# Patient Record
Sex: Male | Born: 1970 | Race: Black or African American | Hispanic: No | Smoking: Current every day smoker
Health system: Southern US, Community
[De-identification: ages and names within clinical notes are randomized; demographics above are authoritative.]

## PROBLEM LIST (undated history)

## (undated) DIAGNOSIS — T7840XA Allergy, unspecified, initial encounter: Secondary | ICD-10-CM

## (undated) DIAGNOSIS — I10 Essential (primary) hypertension: Secondary | ICD-10-CM

## (undated) HISTORY — PX: BACK SURGERY: SHX140

## (undated) HISTORY — PX: ABDOMINAL SURGERY: SHX537

---

## 2001-02-16 ENCOUNTER — Emergency Department (HOSPITAL_COMMUNITY): Admission: EM | Admit: 2001-02-16 | Discharge: 2001-02-16 | Payer: Self-pay | Admitting: Emergency Medicine

## 2001-05-26 ENCOUNTER — Emergency Department (HOSPITAL_COMMUNITY): Admission: EM | Admit: 2001-05-26 | Discharge: 2001-05-26 | Payer: Self-pay

## 2002-03-09 ENCOUNTER — Encounter: Payer: Self-pay | Admitting: General Surgery

## 2002-03-09 ENCOUNTER — Encounter: Payer: Self-pay | Admitting: Emergency Medicine

## 2002-03-09 ENCOUNTER — Inpatient Hospital Stay (HOSPITAL_COMMUNITY): Admission: EM | Admit: 2002-03-09 | Discharge: 2002-03-20 | Payer: Self-pay | Admitting: Emergency Medicine

## 2002-03-10 ENCOUNTER — Encounter: Payer: Self-pay | Admitting: General Surgery

## 2002-03-11 ENCOUNTER — Encounter: Payer: Self-pay | Admitting: Surgery

## 2010-07-01 ENCOUNTER — Emergency Department (HOSPITAL_COMMUNITY): Admission: EM | Admit: 2010-07-01 | Discharge: 2010-07-01 | Payer: Self-pay | Admitting: Emergency Medicine

## 2010-07-01 ENCOUNTER — Encounter: Admission: RE | Admit: 2010-07-01 | Discharge: 2010-07-01 | Payer: Self-pay | Admitting: Family Medicine

## 2010-07-10 ENCOUNTER — Ambulatory Visit (HOSPITAL_COMMUNITY): Admission: RE | Admit: 2010-07-10 | Discharge: 2010-07-10 | Payer: Self-pay | Admitting: Neurological Surgery

## 2010-12-16 LAB — CBC
HCT: 44.2 % (ref 39.0–52.0)
MCH: 27.6 pg (ref 26.0–34.0)
MCHC: 33.3 g/dL (ref 30.0–36.0)
MCV: 82.9 fL (ref 78.0–100.0)
RDW: 14.9 % (ref 11.5–15.5)

## 2010-12-16 LAB — DIFFERENTIAL
Basophils Absolute: 0 10*3/uL (ref 0.0–0.1)
Basophils Relative: 0 % (ref 0–1)
Eosinophils Relative: 2 % (ref 0–5)
Lymphocytes Relative: 35 % (ref 12–46)
Monocytes Absolute: 0.9 10*3/uL (ref 0.1–1.0)

## 2010-12-16 LAB — BASIC METABOLIC PANEL
BUN: 12 mg/dL (ref 6–23)
CO2: 31 mEq/L (ref 19–32)
Chloride: 99 mEq/L (ref 96–112)
Glucose, Bld: 89 mg/dL (ref 70–99)
Potassium: 4.4 mEq/L (ref 3.5–5.1)

## 2010-12-16 LAB — SURGICAL PCR SCREEN: Staphylococcus aureus: NEGATIVE

## 2011-02-19 NOTE — H&P (Signed)
[place]. [facility]  Patient:    Timothy Stewart, Timothy Stewart Visit Number: [id number] MRN: [id number]          Service Type: MED Location: [phone] Attending Physician:  Arlis Porta Dictated by:   Adolph Pollack, M.D. Admit Date:  03/08/2002   CC:         [facility] [address]   History and Physical  CHIEF COMPLAINT:  Crampy abdominal pain with distention, nausea, and vomiting.  HISTORY OF PRESENT ILLNESS:  This is a 40 year old male who had some [place] food two days ago for supper.  He then awoke at 1 a.m. with abdominal distention and cramping pain.  His wife gave him a laxative and then he began having some vomiting.  At first was all of the food he ate and then his wife said that it was greenish.  He has not vomited in over 24 hours.  He did have a bowel movement.  He continued to have the pains and then presented to the emergency department to be evaluated.  During his evaluation, a CT scan of the abdomen was performed.  This was read as having a small bowel obstruction.  No cause can be seen.  The appendix was normal.  It was for this reason that I was asked to see him.  His wife said that he had something like this in the distant past, but not quite as severe, underwent an evaluation, and was told that he had irritable bowel syndrome.  PAST MEDICAL HISTORY:  No chronic illnesses.  PAST SURGICAL HISTORY:  Removal of foot bone spurs.  ALLERGIES:  CODEINE.  CURRENT MEDICATIONS:  None.  SOCIAL HISTORY:  Smokes half of a pack of cigarettes a day.  He has a alcoholic beverage weekly.  He is married.  FAMILY HISTORY:  He had a brother with leukemia.  Father with hypertension.  REVIEW OF SYSTEMS:  Cardiovascular:  No heart disease or hypertension. Pulmonary:  He had pneumonia in the past.  No asthma.  No TB.  GI:  No peptic ulcer disease or diverticulitis.  No colitis.  GU:  No kidney stones. Endocrine:  No  thyroid disease or diabetes.  Hematologic:  No bleeding disorders, transfusions, or deep venous thrombosis.  Neurologic:  No strokes or seizures.  PHYSICAL EXAMINATION:  GENERAL APPEARANCE:  A well-developed, well-nourished male in no acute distress.  He is sleeping on his side.  VITAL SIGNS:  His presenting temperature is 99.3 degrees with a pulse of 83 and a blood pressure of 148/87.  HEENT:  Eyes: Extraocular motions intact.  The sclerae are clear.  NECK:  Supple without palpable masses.  CARDIOVASCULAR:  The heart demonstrates an irregular rate and rhythm without a murmur.  RESPIRATORY:  Breath sounds equal and clear.  Respirations unlabored.  ABDOMEN:  Distended.  There is mild periumbilical tenderness present.  No guarding.  No palpable masses.  No bowel sounds heard.  GENITOURINARY:  No penial lesions.  No groin hernias.  RECTAL:  Normal tone.  Prostate smooth.  No masses.  Stool is green and Hemoccult positive.  EXTREMITIES:  Full range of motion.  No cyanosis or edema.  NEUROLOGIC:  Normal motor strength.  LABORATORY DATA:  The white blood cell count is 12,900 with a leftward shift, hemoglobin 14.1, and platelet count 257,000.  The amylase and lipase are normal.  The CMET is normal, except for a slightly elevated glucose of 117. The urinalysis is negative.  CT scan of the abdomen demonstrates dilated loops of small bowel all the way down to the ileocecal region.  There is a suggestion of some thickening in the ileocecal region, but no inflammatory changes.  The risks of free fluid are present in the pelvis.  Plain abdominal x-rays again demonstrate dilated loops of small bowel.  IMPRESSION:  Crampy abdominal pain with nausea and vomiting.  CT initially read as small bowel obstruction, however, in looking at the CT there is no small bowel transition point.  Mild thickening of the distal ileum which may be terminal ileitis.  He does have some Hemoccult positive  stool tested once. This may be an ileus secondary to some gastroenteritis or potential partial small bowel obstruction from some sort of inflammatory-type bowel disease (Crohns).  He currently does not have peritonitis.  He has not vomited in over 24 hours.  PLAN:  Admission to the hospital.  Repeat abdominal films.  Observation for now, but may require exploratory laparotomy for diagnosis if the condition persists.  This has been explained to him and his wife. Dictated by:   Adolph Pollack, M.D. Attending Physician:  Arlis Porta DD:  03/09/02 TD:  03/10/02 Job: [id number]

## 2011-02-19 NOTE — Op Note (Signed)
[place]. [facility]  Patient:    MUNG, RINKER Visit Number: [id number] MRN: [id number]          Service Type: MED Location: [phone] Attending Physician:  Arlis Porta Dictated by:   Sandria Bales. Ezzard Standing, M.D. Proc. Date: 03/11/02 Admit Date:  03/08/2002                             Operative Report  DATE OF BIRTH:  November 20, 1970.  PREOPERATIVE DIAGNOSIS:  Small bowel obstruction.  POSTOPERATIVE DIAGNOSIS:  Small bowel obstruction secondary to foreign body obstruction at terminal ileum secondary to folded one-dollar bill.  OPERATION PERFORMED:  Abdominal exploration with enterotomy and removal of foreign body.  SURGEON:  Sandria Bales. Ezzard Standing, M.D.  ASSISTANT:  Anselm Pancoast. Zachery Dakins, M.D.  ANESTHESIA:  General endotracheal.  ESTIMATED BLOOD LOSS:  100 cc.  DRAINS:  None.  INDICATIONS FOR PROCEDURE:  The patient is a 40 year old black male who was admitted on the fifth of May with an ileus versus bowel obstruction.  He has been two days n.p.o., had a nasogastric tube placed, has not improved from abdominal distension.  He now comes for abdominal exploration.  The indications and potential complications of the procedure were explained to the patient.  DESCRIPTION OF PROCEDURE:  The patient was placed in a supine position, given a general anesthesia.  He was given 2 cm of cefotetan at the initiation of the procedure.  He had a nasogastric tube placed.  His abdomen was prepped with Betadine solution and sterilely draped.  A midline abdominal incision was made with sharp dissection carried down to the abdominal cavity.  The abdomen was explored.  The right and left lobes of the liver were unremarkable.  The stomach was unremarkable.  The gallbladder was unremarkable.  The bowel was massively dilated so I spent probably a good 45 minutes milking the small bowel contents back into the stomach.  Initially I could find no point of obstruction.   However, after milking for a while, I found a foreign body folded in the distal one third of his ileum which proved to be a folded one-dollar bill.  This was milked up to an area of healthy bowel.  An enterotomy was made on the antimesenteric surface.  The one-dollar bill was removed and the enterotomy was closed with interrupted 3-0 silk sutures.  I then irrigated the abdomen with two liters of saline.  The patient also had about 1000 cc of ascitic fluid in the abdominal cavity, probably from the chronic bowel obstruction.  This was also aspirated out.  The abdomen was then closed with two running #1 Novofil sutures and the skin closed with a skin gun.  The patient tolerated the procedure well.  The one-dollar bill was given to the wife.  Sponge and needle counts were correct at the end of the case. Dictated by:   Sandria Bales. Ezzard Standing, M.D. Attending Physician:  Arlis Porta DD:  03/11/02 TD:  03/13/02 Job: [id number]

## 2011-02-19 NOTE — Discharge Summary (Signed)
NAME:  Timothy Stewart, Timothy Stewart                         ACCOUNT NO.:  [id number]   MEDICAL RECORD NO.:  [id number]                   PATIENT TYPE:  NP   LOCATION:  5730                                 FACILITY:  [facility]   PHYSICIAN:  Adolph Pollack, M.D.            DATE OF BIRTH:  Apr 15, 1971   DATE OF ADMISSION:  03/08/2002  DATE OF DISCHARGE:  03/20/2002                                 DISCHARGE SUMMARY   PRINCIPAL DISCHARGE DIAGNOSIS::  Small bowel obstruction secondary to distal  small bowel foreign body.   SECONDARY DIAGNOSES::  1. Postoperative ileus.  2. Wound infection.   PROCEDURE:  Exploratory laparotomy with enterotomy and removal of foreign  body (a dollar bill) 03/11/2002.   REASON FOR ADMISSION:  This is a 40 year old male who ate some [place] food  and began having onset of crampy abdominal pain and distention the night  before his admission.  He took a laxative and then began vomiting.  He did  have a bowel movement but without relief of his symptoms.  No fever or  chills.  He did not have any abdominal operations before.  His CT scan  demonstrates the entire small bowel appeared to be dilated to the ileocecal  valve with a questionable thickening of the ileocecal valve.  He  subsequently was admitted.   HOSPITAL COURSE:  There is no specific transition point on the CT scan so  the question was did he have Crohn's disease.  CBC demonstrated a normal  white count later that day and the x-rays were slightly improved.  There is  a question whether this is an ileus or small bowel obstruction.  However,  this again failed to completely improve and he subsequently was taken to the  operating room where he was found to have a small bowel obstruction  secondary to a folded $1 dollar bill at the terminal ileum area.  Postoperatively his course was notable for a post-op ileus and then a wound  infection.  The wound was opened up and dressing changes started.  He began  having  bowel function, tolerating a diet and had adequate wound care.  By  post-op day nine, 03/20/2002, he was felt to be satisfactory for discharge.   DISPOSITION:  Discharge to home on 03/20/2002.  He was taught how to clean  the wound and pack with saline gauze.  He was given Vicodin for pain and was  told to follow up with Dr. Ezzard Standing in one week.  He was discharged in  satisfactory condition.                                               Adolph Pollack, M.D.    Kari Baars  D:  05/01/2002  T:  05/04/2002  Job:  [id number]

## 2011-08-14 IMAGING — CR DG CHEST 2V
3 series · 3 of 3 positions shown · non-contrast
Comparison: None.

CLINICAL DATA: Pre admit for disc herniation

CHEST - 2 VIEW

[view not recorded (1 of 3)]
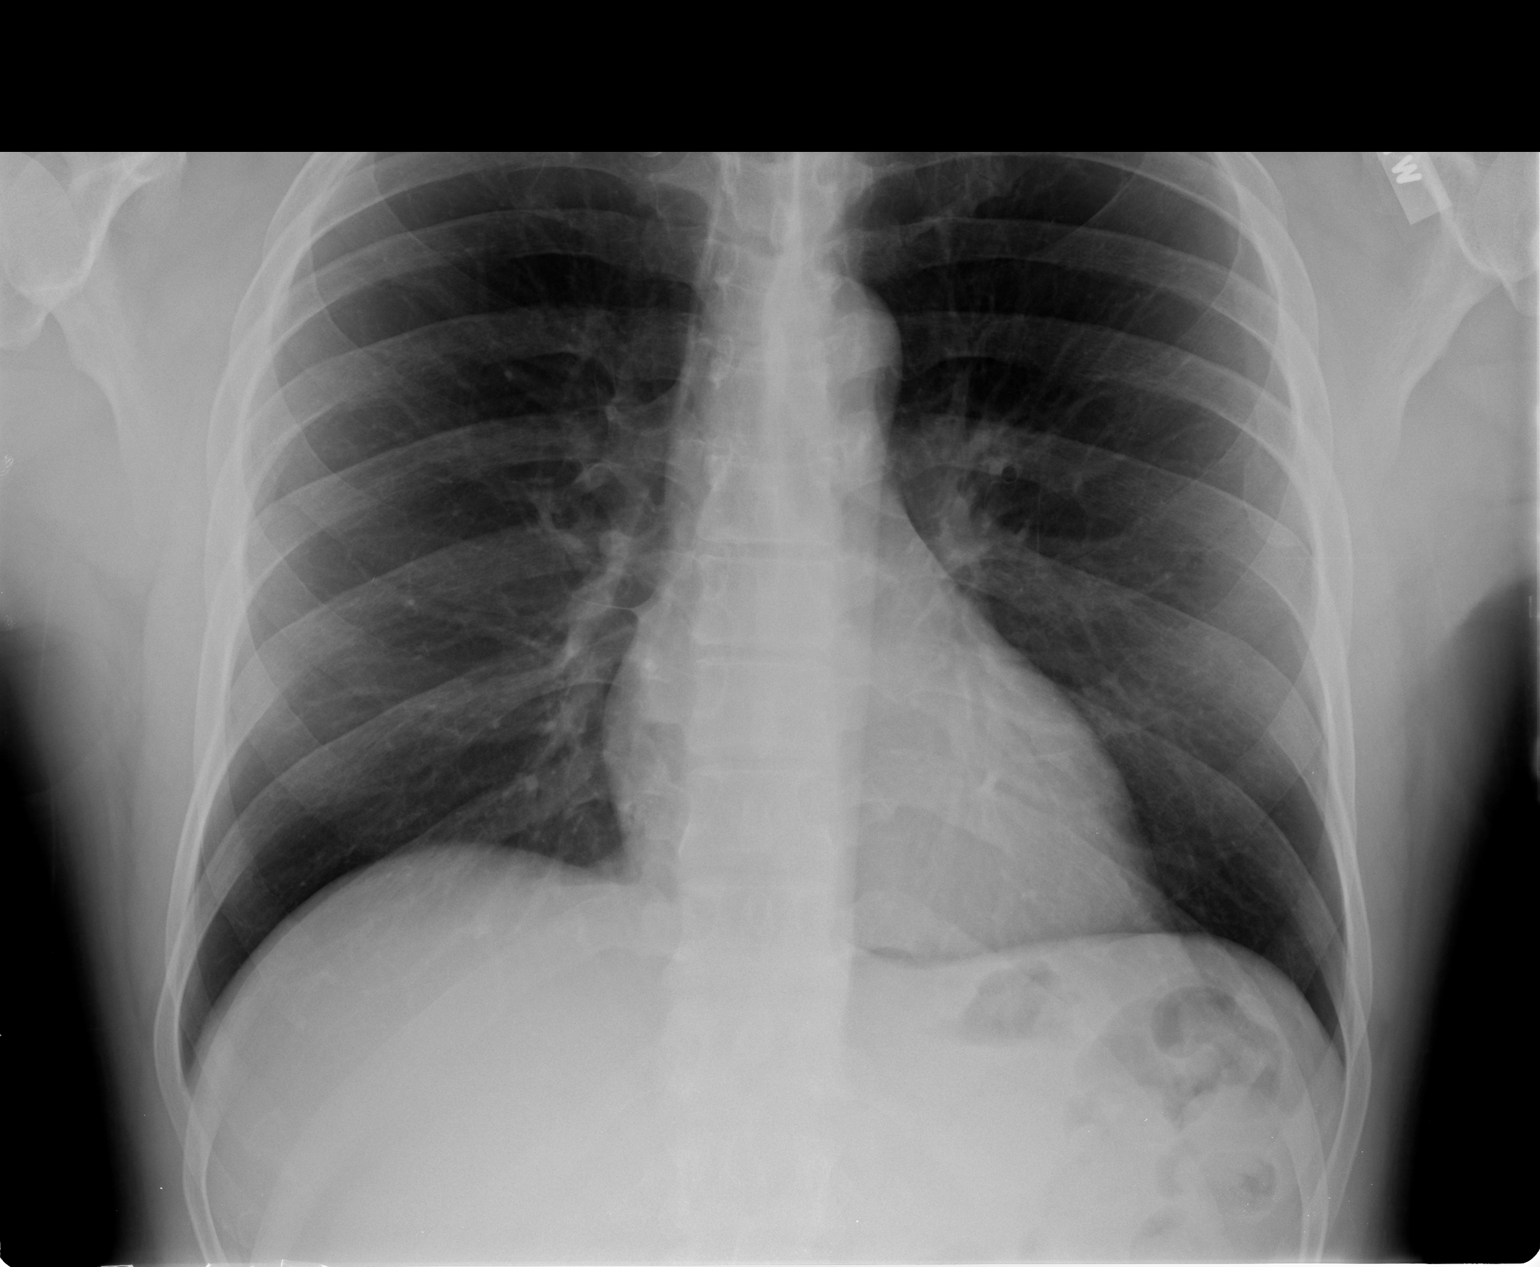

[view not recorded (2 of 3)]
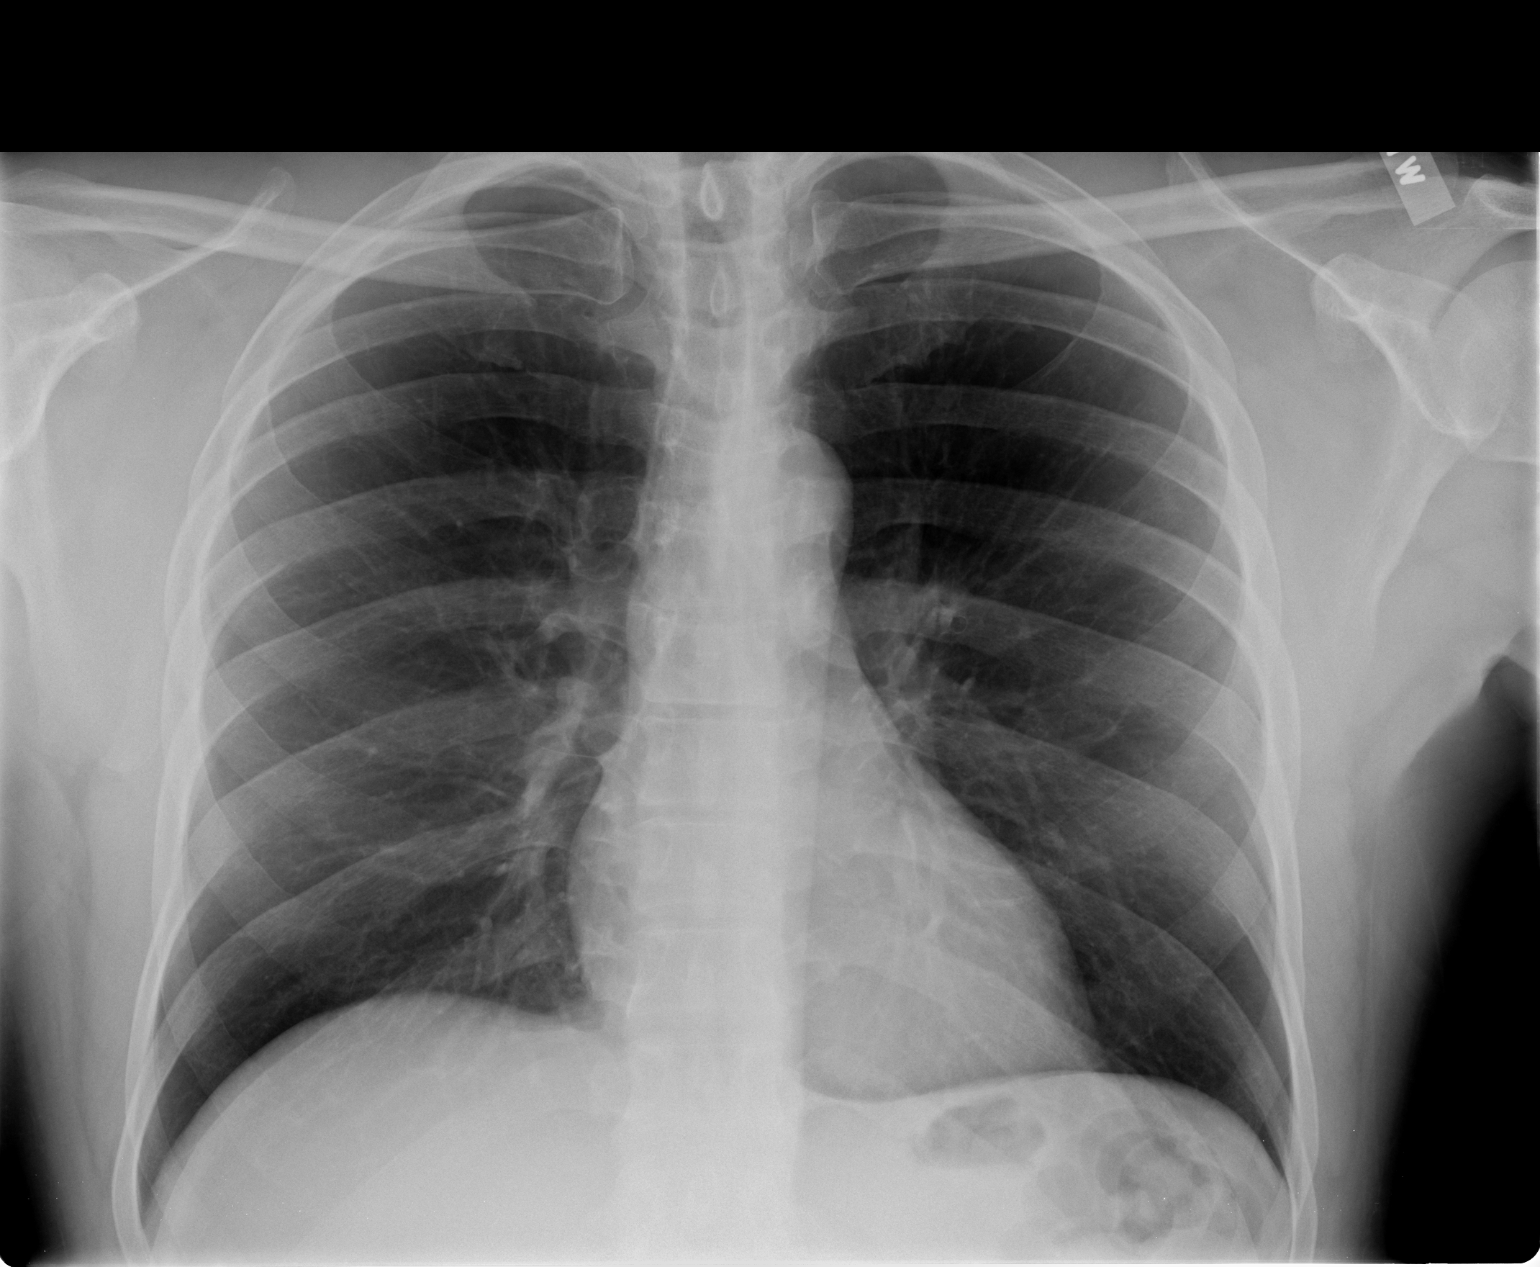

[view not recorded (3 of 3)]
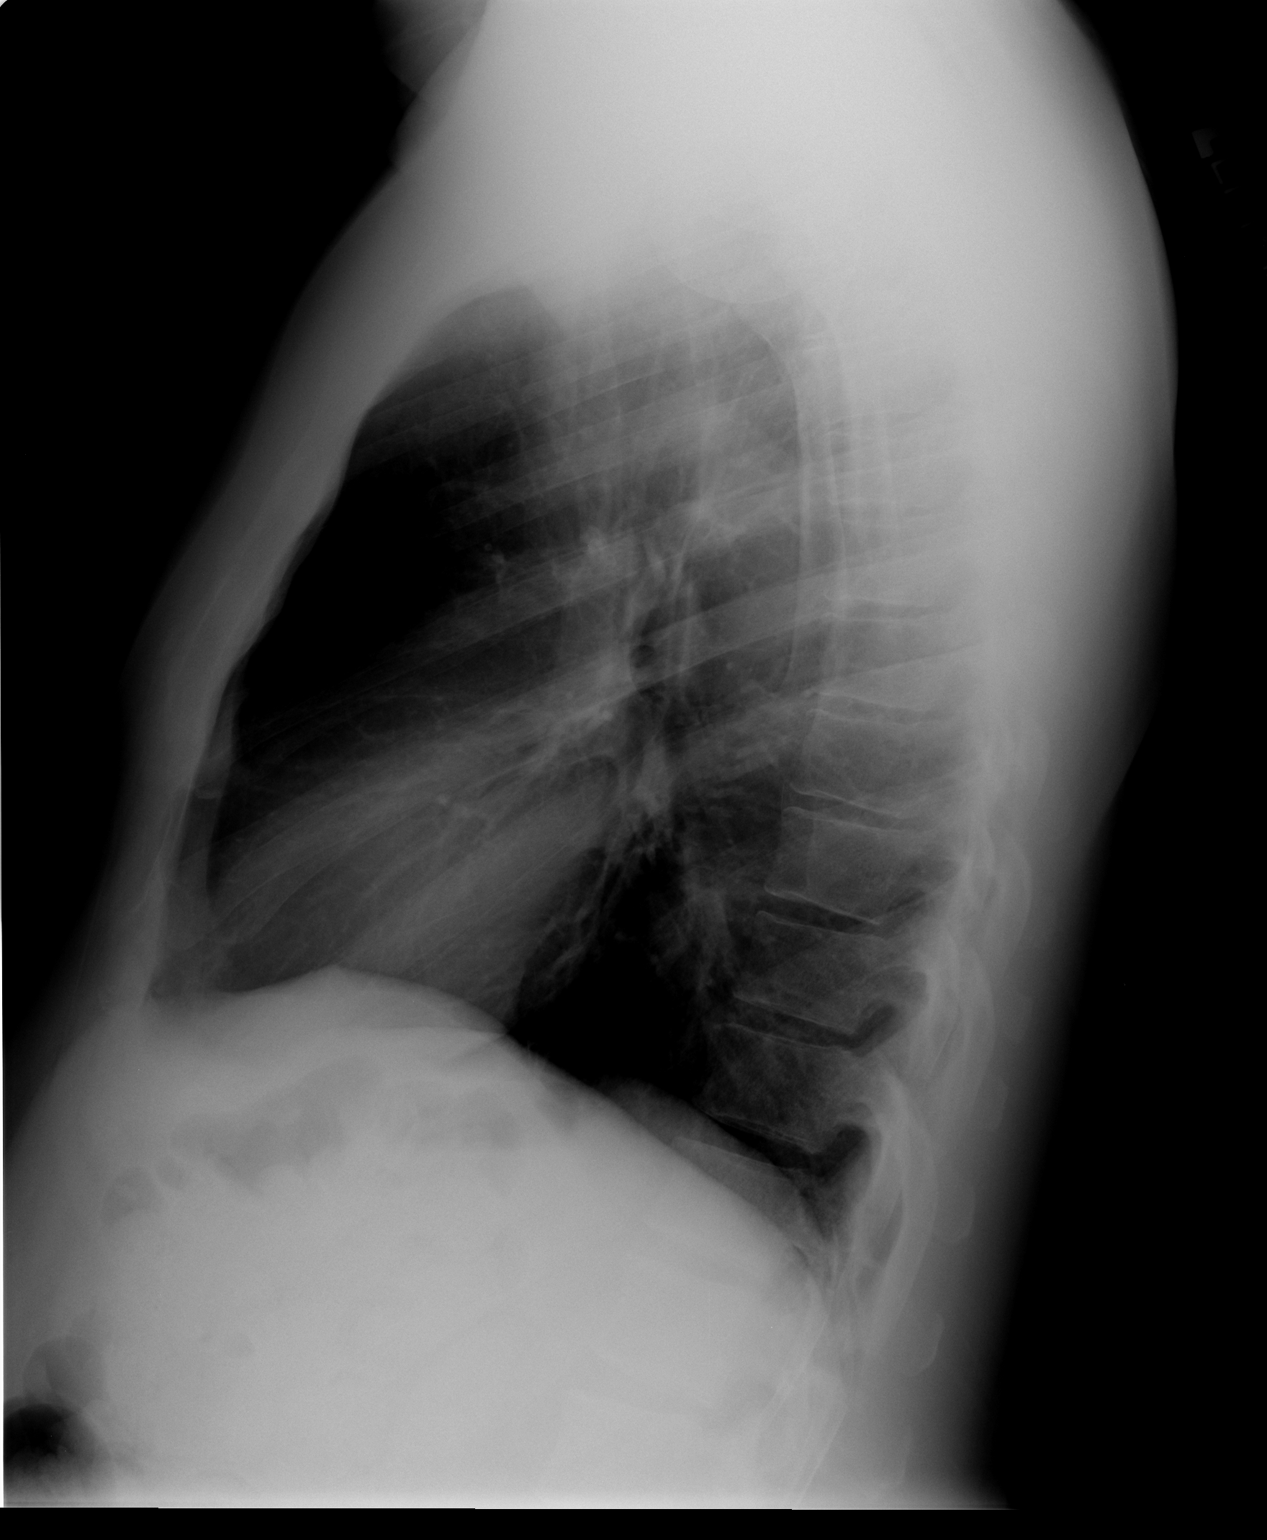

[3 of 3 positions shown; findings below may reference images not displayed]

FINDINGS: Heart and mediastinal contours normal.  Lungs clear.  No
pleural fluid.  Osseous structures and soft tissues unremarkable.
IMPRESSION: No acute or significant findings.

.

## 2011-08-15 IMAGING — CR DG LUMBAR SPINE 2-3V
1 series · 1 of 1 positions shown · non-contrast
Comparison: Preoperative MRI 07/01/2010

CLINICAL DATA: L5-S1 microdiskectomy.  Intraoperative imaging.

LUMBAR SPINE - 2-3 VIEW

[view not recorded]
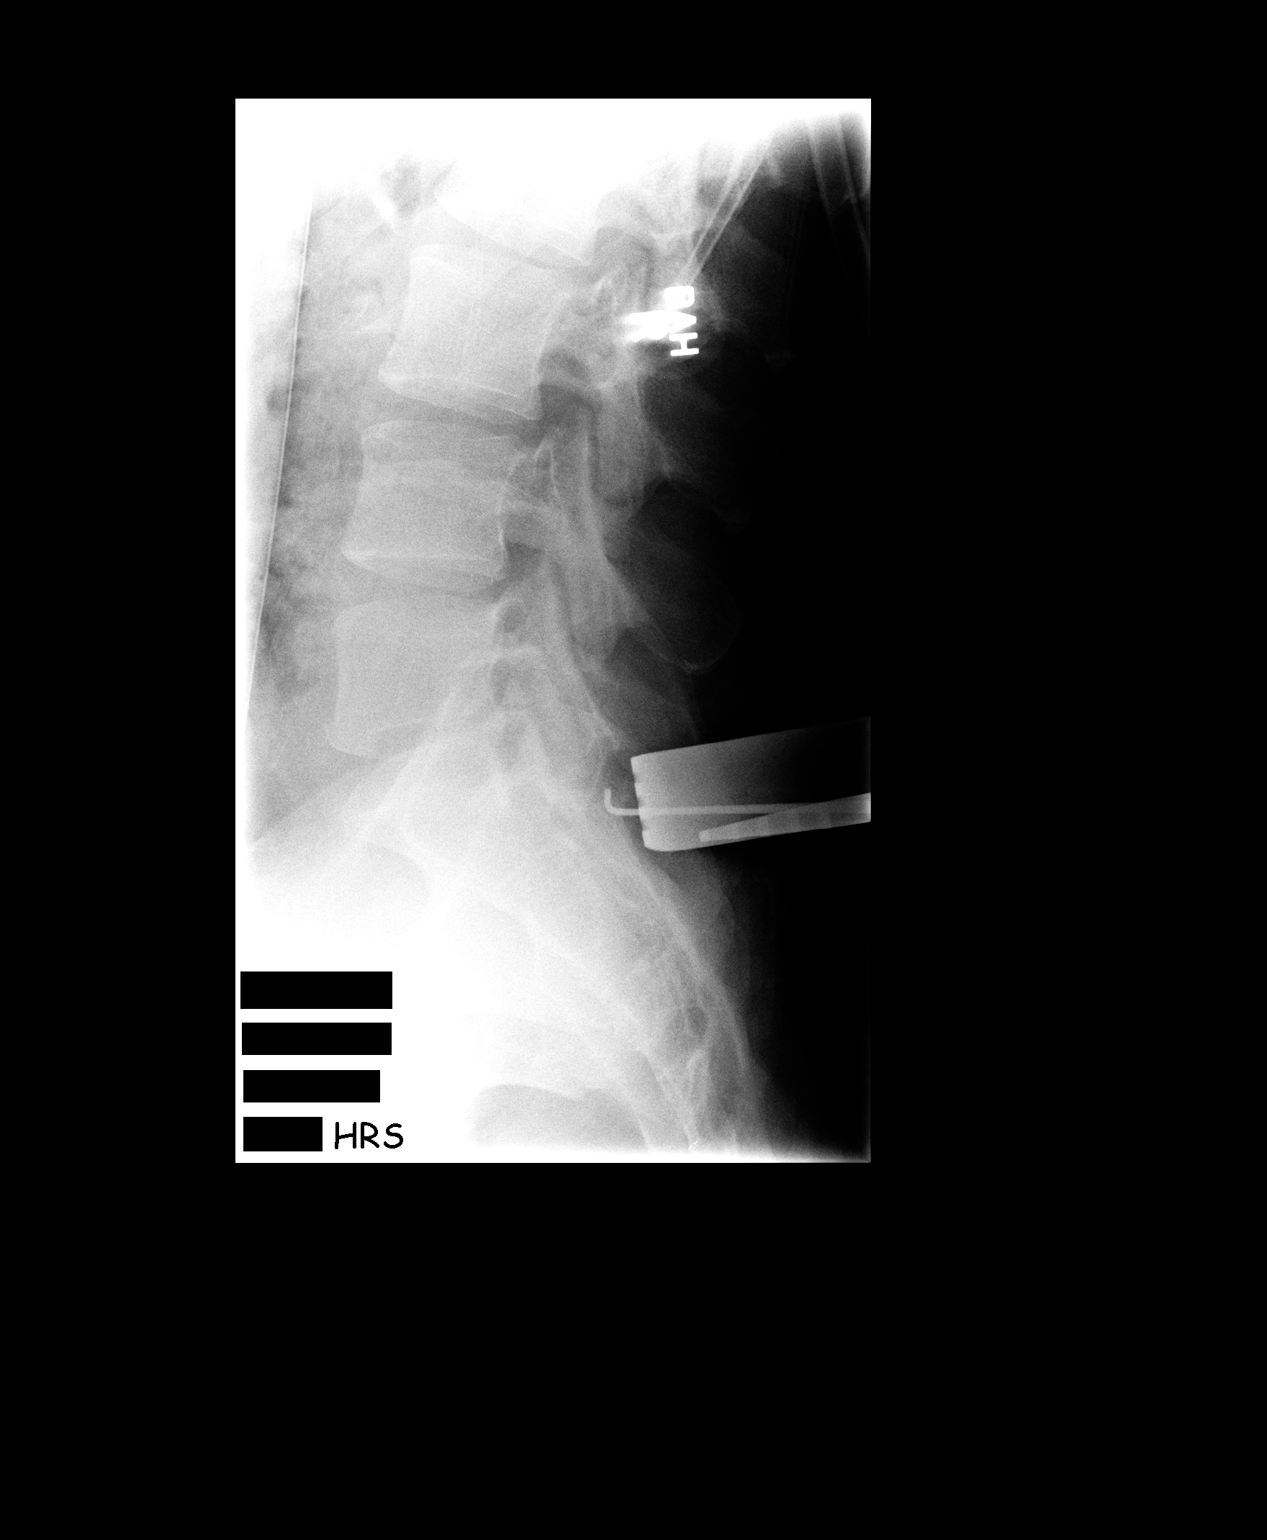

[1 of 1 positions shown; findings below may reference images not displayed]

FINDINGS: Two images are provided.  Initial image initial image
[DATE] p.m. demonstrates radiopaque marker posterior to the S1 level.
The second image demonstrates the marker posterior to the L5-S1
interspace.  Numbering is as per prior MRI.
IMPRESSION: Intraoperative localization as above.

## 2013-11-26 ENCOUNTER — Encounter (HOSPITAL_COMMUNITY): Payer: Self-pay | Admitting: Emergency Medicine

## 2013-11-26 ENCOUNTER — Emergency Department (HOSPITAL_COMMUNITY)
Admission: EM | Admit: 2013-11-26 | Discharge: 2013-11-26 | Disposition: A | Discharge status: Home / Self Care | Payer: BC Managed Care – PPO | Attending: Emergency Medicine | Admitting: Emergency Medicine

## 2013-11-26 DIAGNOSIS — T4995XA Adverse effect of unspecified topical agent, initial encounter: Secondary | ICD-10-CM | POA: Insufficient documentation

## 2013-11-26 DIAGNOSIS — F17200 Nicotine dependence, unspecified, uncomplicated: Secondary | ICD-10-CM | POA: Insufficient documentation

## 2013-11-26 DIAGNOSIS — T783XXA Angioneurotic edema, initial encounter: Secondary | ICD-10-CM | POA: Insufficient documentation

## 2013-11-26 HISTORY — DX: Allergy, unspecified, initial encounter: T78.40XA

## 2013-11-26 MED ORDER — DIPHENHYDRAMINE HCL 50 MG/ML IJ SOLN
25.0000 mg | Freq: Once | INTRAMUSCULAR | Status: AC
  Administered 2013-11-26: 25 mg via INTRAVENOUS
  Filled 2013-11-26: qty 1

## 2013-11-26 MED ORDER — EPINEPHRINE 0.3 MG/0.3ML IJ SOAJ: 0.3000 mg | Freq: Once | INTRAMUSCULAR | Status: AC | PRN

## 2013-11-26 MED ORDER — METHYLPREDNISOLONE SODIUM SUCC 125 MG IJ SOLR
125.0000 mg | Freq: Once | INTRAMUSCULAR | Status: AC
  Administered 2013-11-26: 125 mg via INTRAVENOUS
  Filled 2013-11-26: qty 2

## 2013-11-26 MED ORDER — PREDNISONE 20 MG PO TABS: 40.0000 mg | ORAL_TABLET | Freq: Every day | ORAL | Status: DC

## 2013-11-26 MED ORDER — FAMOTIDINE IN NACL 20-0.9 MG/50ML-% IV SOLN
20.0000 mg | Freq: Once | INTRAVENOUS | Status: AC
  Administered 2013-11-26: 20 mg via INTRAVENOUS
  Filled 2013-11-26: qty 50

## 2013-11-26 NOTE — Discharge Instructions (Signed)
Angioedema Angioedema is a sudden swelling of tissues, often of the skin. It can occur on the face or genitals or in the abdomen or other body parts. The swelling usually develops over a short period and gets better in 24 to 48 hours. It often begins during the night and is found when the person wakes up. The person may also get red, itchy patches of skin (hives). Angioedema can be dangerous if it involves swelling of the air passages.  Depending on the cause, episodes of angioedema may only happen once, come back in unpredictable patterns, or repeat for several years and then gradually fade away.  CAUSES  Angioedema can be caused by an allergic reaction to various triggers. It can also result from nonallergic causes, including reactions to drugs, immune system disorders, viral infections, or an abnormal gene that is passed to you from your parents (hereditary). For some people with angioedema, the cause is unknown.  Some things that can trigger angioedema include:   Foods.   Medicines, such as ACE inhibitors, ARBs, nonsteroidal anti-inflammatory agents, or estrogen.   Latex.   Animal saliva.   Insect stings.   Dyes used in X-rays.   Mild injury.   Dental work.  Surgery.  Stress.   Sudden changes in temperature.   Exercise. SIGNS AND SYMPTOMS   Swelling of the skin.  Hives. If these are present, there is also intense itching.  Redness in the affected area.   Pain in the affected area.  Swollen lips or tongue.  Breathing problems. This may happen if the air passages swell.  Wheezing. If internal organs are involved, there may be:   Nausea.   Abdominal pain.   Vomiting.   Difficulty swallowing.   Difficulty passing urine. DIAGNOSIS   Your health care provider will examine the affected area and take a medical and family history.  Various tests may be done to help determine the cause. Tests may include:  Allergy skin tests to see if the problem  is an allergic reaction.   Blood tests to check for hereditary angioedema.   Tests to check for underlying diseases that could cause the condition.   A review of your medicines, including over the counter medicines, may be done. TREATMENT  Treatment will depend on the cause of the angioedema. Possible treatments include:   Removal of anything that triggered the condition (such as stopping certain medicines).   Medicines to treat symptoms or prevent attacks. Medicines given may include:   Antihistamines.   Epinephrine injection.   Steroids.   Hospitalization may be required for severe attacks. If the air passages are affected, it can be an emergency. Tubes may need to be placed to keep the airway open. HOME CARE INSTRUCTIONS   Only take over-the-counter or prescription medicines as directed by your health care provider.  If you were given medicines for emergency allergy treatment, always carry them with you.  Wear a medical bracelet as directed by your health care provider.   Avoid known triggers. SEEK MEDICAL CARE IF:   You have repeat attacks of angioedema.   Your attacks are more frequent or more severe despite preventive measures.   You have hereditary angioedema and are considering having children. It is important to discuss the risks of passing the condition on to your children with your health care provider. SEEK IMMEDIATE MEDICAL CARE IF:   You have severe swelling of the mouth, tongue, or lips.  You have difficulty breathing.   You have difficulty swallowing.     You faint. MAKE SURE YOU:  Understand these instructions.  Will watch your condition.  Will get help right away if you are not doing well or get worse. Document Released: 11/29/2001 Document Revised: 07/11/2013 Document Reviewed: 05/14/2013 [employer] Patient Information 2014 [employer].  

## 2013-11-26 NOTE — ED Provider Notes (Signed)
CSN: [id number]     Arrival date & time 11/26/13  1003 History  This chart was scribed for non-physician practitioner, Montine Circle, PA-C working with Maudry Diego, MD by Frederich Balding, ED scribe. This patient was seen in room TR08C/TR08C and the patient's care was started at 10:48 AM.   Chief Complaint  Patient presents with  . Oral Swelling   The history is provided by the patient. No language interpreter was used.   HPI Comments: Timothy Stewart is a 43 y.o. male who presents to the Emergency Department complaining of upper lip swelling that started about 11 hours ago. He states he took two benadryl when it first started and took another benadryl 4 hours later. Pt states it was still swollen when he woke up at 7 AM so he used his epi pen. Pt is unsure if his tongue started swelling. He has had two similar episodes in the past 3 months. Denies new foods, soaps, detergents or medications. Pt has an appointment with an allergy specialist 12/04/13.   Past Medical History  Diagnosis Date  . Allergic reaction    Past Surgical History  Procedure Laterality Date  . Back surgery    . Abdominal surgery     No family history on file. History  Substance Use Topics  . Smoking status: Current Every Day Smoker  . Smokeless tobacco: Not on file  . Alcohol Use: Yes    Review of Systems  Constitutional: Negative for fever.  HENT: Positive for facial swelling. Negative for drooling.   Eyes: Negative for redness.  Respiratory: Negative for shortness of breath.   Musculoskeletal: Negative for gait problem.  Skin: Negative for wound.  Neurological: Negative for speech difficulty.  Psychiatric/Behavioral: Negative for confusion.   Allergies  Review of patient's allergies indicates not on file.  Home Medications  No current outpatient prescriptions on file.  BP 173/103  Pulse 62  Temp(Src) 97.9 F (36.6 C)  Resp 18  Ht 5\' 11"  (1.803 m)  Wt 205 lb (92.987 kg)  BMI 28.60 kg/m2  SpO2  98%  Physical Exam  Nursing note and vitals reviewed. Constitutional: He is oriented to person, place, and time. He appears well-developed. No distress.  HENT:  Head: Normocephalic and atraumatic.  Right upper lip is moderately swollen. No tongue swelling. No evidence of ludwig's angina. No peritonsillar abscess. Airway is intact. No drooling.   Eyes: Conjunctivae and EOM are normal.  Neck:  No stridor.   Cardiovascular: Normal rate and regular rhythm.   Pulmonary/Chest: Effort normal. No stridor. No respiratory distress.  Abdominal: He exhibits no distension.  Musculoskeletal: He exhibits no edema.  Neurological: He is alert and oriented to person, place, and time.  Skin: Skin is warm and dry.  Psychiatric: He has a normal mood and affect.    ED Course  Procedures (including critical care time)  DIAGNOSTIC STUDIES: Oxygen Saturation is 98% on RA, normal by my interpretation.    COORDINATION OF CARE: 10:55 AM-Discussed treatment plan which includes benadryl, pepcid and solumedrol with pt at bedside and pt agreed to plan.   Labs Review Labs Reviewed - No data to display Imaging Review No results found.  EKG Interpretation   None       MDM   Final diagnoses:  Angioedema    Pt with mild angioedema of upper lip. No airway compromise. Pt used epi pen at 0700. States that symptoms have improved. Will treat with solumedrol, pepcid and benadryl. Will obs for 1-2  hours, making total time since epi pen use 4-6 hours.   12:24 PM Patient reevaluated. He is feeling better. The lip swelling has decreased. No tongue swelling. No stridor, or drooling. Patient has allergies to followup in a week or 2. He is stable and ready for discharge.  I personally performed the services described in this documentation, which was scribed in my presence. The recorded information has been reviewed and is accurate.    Montine Circle, PA-C 11/26/13 1225

## 2013-11-26 NOTE — ED Notes (Signed)
Pt has had several episodes of lip and facial swelling in the past 2-3 months. Was given an Epi pen by his PCP, Dr. Nancy Fetter with [facility]. Woke this am with right lip and facial swelling. Took 50mg  Benadryl. At 0700, "I took my Epi pen but my Dr said I should not have done that". Pt has a picture on his phone of what he looked like at 0700. Swelling has decreased significantly. No respiratory distress. Has appointment with an allergist March 3.

## 2013-11-26 NOTE — ED Notes (Signed)
Pt c/o upper lip swelling onset today at [place]. Pt took benadryl at that time and went back to bed. Pt took his epipen around 0700 today. Pt has had similar episode in past x 2, Pt denies SOB at present or use of new products. Pt has appointment with allergy specialist.

## 2013-11-26 NOTE — ED Provider Notes (Signed)
Medical screening examination/treatment/procedure(s) were performed by non-physician practitioner and as supervising physician I was immediately available for consultation/collaboration.  EKG Interpretation   None         Ramses Klecka L Darchelle Nunes, MD 11/26/13 1520 

## 2013-11-26 NOTE — ED Notes (Signed)
Pepcid infusion complete.

## 2015-01-17 ENCOUNTER — Other Ambulatory Visit: Payer: Self-pay | Admitting: *Deleted

## 2015-01-17 ENCOUNTER — Other Ambulatory Visit (INDEPENDENT_AMBULATORY_CARE_PROVIDER_SITE_OTHER): Payer: Self-pay | Admitting: General Surgery

## 2015-01-17 DIAGNOSIS — M549 Dorsalgia, unspecified: Secondary | ICD-10-CM

## 2015-01-17 NOTE — Addendum Note (Signed)
Addended by: Adin Hector on: 01/17/2015 03:38 PM   Modules accepted: Orders

## 2015-01-20 ENCOUNTER — Other Ambulatory Visit: Payer: Self-pay | Admitting: *Deleted

## 2015-01-20 DIAGNOSIS — M549 Dorsalgia, unspecified: Secondary | ICD-10-CM

## 2015-01-30 ENCOUNTER — Ambulatory Visit
Admission: RE | Admit: 2015-01-30 | Discharge: 2015-01-30 | Disposition: A | Discharge status: Home / Self Care | Payer: Managed Care, Other (non HMO) | Source: Ambulatory Visit | Attending: General Surgery | Admitting: General Surgery

## 2015-02-06 ENCOUNTER — Other Ambulatory Visit: Payer: Self-pay | Admitting: General Surgery

## 2016-03-29 ENCOUNTER — Emergency Department (HOSPITAL_COMMUNITY)
Admission: EM | Admit: 2016-03-29 | Discharge: 2016-03-29 | Disposition: A | Discharge status: Home / Self Care | Payer: Managed Care, Other (non HMO)

## 2016-03-29 NOTE — ED Notes (Signed)
Called for patient for triage.  No response

## 2016-03-29 NOTE — ED Notes (Signed)
Called patient he didn't answer called three times

## 2016-03-29 NOTE — ED Notes (Signed)
Called for triage no answer  

## 2016-04-05 ENCOUNTER — Other Ambulatory Visit: Payer: Self-pay | Admitting: Family Medicine

## 2016-04-05 DIAGNOSIS — R1011 Right upper quadrant pain: Secondary | ICD-10-CM

## 2016-04-05 DIAGNOSIS — R110 Nausea: Secondary | ICD-10-CM

## 2016-04-05 DIAGNOSIS — R1013 Epigastric pain: Secondary | ICD-10-CM

## 2016-04-14 ENCOUNTER — Ambulatory Visit
Admission: RE | Admit: 2016-04-14 | Discharge: 2016-04-14 | Disposition: A | Discharge status: Home / Self Care | Payer: Managed Care, Other (non HMO) | Source: Ambulatory Visit | Attending: Family Medicine | Admitting: Family Medicine

## 2016-04-14 DIAGNOSIS — R1013 Epigastric pain: Secondary | ICD-10-CM

## 2016-04-14 DIAGNOSIS — R1011 Right upper quadrant pain: Secondary | ICD-10-CM

## 2016-04-14 DIAGNOSIS — R110 Nausea: Secondary | ICD-10-CM

## 2018-02-22 ENCOUNTER — Emergency Department (HOSPITAL_COMMUNITY)
Admission: EM | Admit: 2018-02-22 | Discharge: 2018-02-23 | Disposition: A | Discharge status: Home / Self Care | Payer: 59 | Attending: Emergency Medicine | Admitting: Emergency Medicine

## 2018-02-22 ENCOUNTER — Other Ambulatory Visit: Payer: Self-pay

## 2018-02-22 ENCOUNTER — Encounter (HOSPITAL_COMMUNITY): Payer: Self-pay | Admitting: *Deleted

## 2018-02-22 DIAGNOSIS — F17200 Nicotine dependence, unspecified, uncomplicated: Secondary | ICD-10-CM | POA: Diagnosis not present

## 2018-02-22 DIAGNOSIS — Y92019 Unspecified place in single-family (private) house as the place of occurrence of the external cause: Secondary | ICD-10-CM | POA: Insufficient documentation

## 2018-02-22 DIAGNOSIS — Y999 Unspecified external cause status: Secondary | ICD-10-CM | POA: Diagnosis not present

## 2018-02-22 DIAGNOSIS — I10 Essential (primary) hypertension: Secondary | ICD-10-CM | POA: Diagnosis not present

## 2018-02-22 DIAGNOSIS — H6121 Impacted cerumen, right ear: Secondary | ICD-10-CM | POA: Diagnosis present

## 2018-02-22 DIAGNOSIS — T161XXA Foreign body in right ear, initial encounter: Secondary | ICD-10-CM

## 2018-02-22 DIAGNOSIS — Y93E8 Activity, other personal hygiene: Secondary | ICD-10-CM | POA: Insufficient documentation

## 2018-02-22 DIAGNOSIS — Z79899 Other long term (current) drug therapy: Secondary | ICD-10-CM | POA: Insufficient documentation

## 2018-02-22 DIAGNOSIS — X58XXXA Exposure to other specified factors, initial encounter: Secondary | ICD-10-CM | POA: Diagnosis not present

## 2018-02-22 HISTORY — DX: Essential (primary) hypertension: I10

## 2018-02-22 NOTE — ED Triage Notes (Signed)
Pt c/o R ear pain for the past few days, tried flushing ear with peroxide without relief. Pt noted to be hypertensive, does not take medication as prescribed

## 2018-02-23 NOTE — ED Provider Notes (Signed)
[facility] EMERGENCY DEPARTMENT Provider Note   CSN: [id number] Arrival date & time: 02/22/18  2227     History   Chief Complaint Chief Complaint  Patient presents with  . Cerumen Impaction    HPI Timothy Stewart is a 47 y.o. male.  HPI   Patient is a 47 year old male with a history of hypertension presenting for foreign body sensation and sensation of fullness of the right ear.  Patient reports that his fingers were the last 2 days.  Patient reports he felt that he had a significant amount of wax in the ear, and attempted to remove with Q-tip.  Subsequently, patient attempted with peroxide, but still has persistent sensation of fullness.  Patient denies any loss of hearing, ear drainage, or ear pain.  Patient reports that he has allergic rhinitis, and was congested last week, however this is improved.  No sore throat or coughing.  No fever chills.  Past Medical History:  Diagnosis Date  . Allergic reaction   . Hypertension     There are no active problems to display for this patient.   Past Surgical History:  Procedure Laterality Date  . ABDOMINAL SURGERY    . BACK SURGERY          Home Medications    Prior to Admission medications   Medication Sig Start Date End Date Taking? Authorizing Provider  EPINEPHrine (EPIPEN 2-PAK) 0.3 mg/0.3 mL SOAJ injection Inject 0.3 mLs (0.3 mg total) into the muscle once as needed (for severe allergic reaction). CAll 911 immediately if you have to use this medicine 11/26/13   Montine Circle, PA-C  HYDROcodone-acetaminophen (NORCO/VICODIN) 5-325 MG per tablet Take 1-2 tablets by mouth every 6 (six) hours as needed for moderate pain.    [provider]  meloxicam (MOBIC) 15 MG tablet Take 15 mg by mouth daily.    [provider]  predniSONE (DELTASONE) 20 MG tablet Take 2 tablets (40 mg total) by mouth daily. 11/26/13   Montine Circle, PA-C  PRESCRIPTION MEDICATION Steroid injection    [provider]  traMADol (ULTRAM) 50 MG tablet Take 50-100 mg by mouth every 6 (six) hours as needed for moderate pain.    [provider]    Family History No family history on file.  Social History Social History   Tobacco Use  . Smoking status: Current Every Day Smoker  Substance Use Topics  . Alcohol use: Yes  . Drug use: Yes    Types: Marijuana     Allergies   Codeine   Review of Systems Review of Systems  Constitutional: Negative for chills and fever.  HENT: Negative for congestion, ear discharge, ear pain, rhinorrhea and sinus pressure.        +Ear fullness  Eyes: Negative for visual disturbance.  Respiratory: Negative for cough and shortness of breath.   Cardiovascular: Negative for chest pain.  Neurological: Negative for light-headedness and headaches.     Physical Exam Updated Vital Signs BP (!) 183/115 Comment: Pt states he does not take Rx linsopril d/t SE  Pulse 63 Comment: Pt states he does not take Rx linsopril d/t SE  Temp 98.1 F (36.7 C)   Resp 16   SpO2 100% Comment: Pt states he does not take Rx linsopril d/t SE  Physical Exam  Constitutional: He appears well-developed and well-nourished. No distress.  Sitting comfortably in bed.  HENT:  Head: Normocephalic and atraumatic.  No tenderness to palpation of right auricle, tragus, mastoid.  No erythema, edema, or drainage of right external auditory canal. Patient exhibits a piece of cotton in the right external auditory canal that interferes with evaluation of tympanic membrane.  Eyes: Conjunctivae are normal. Right eye exhibits no discharge. Left eye exhibits no discharge.  EOMs normal to gross examination.  Neck: Normal range of motion.  Cardiovascular: Normal rate and regular rhythm.  Intact, 2+ radial pulse.  Pulmonary/Chest:  Normal respiratory effort. Patient converses comfortably. No audible wheeze or stridor.  Abdominal: He exhibits no distension.  Musculoskeletal: Normal  range of motion.  Neurological: He is alert.  Cranial nerves intact to gross observation. Patient moves extremities without difficulty.  Skin: Skin is warm and dry. He is not diaphoretic.  Psychiatric: He has a normal mood and affect. His behavior is normal. Judgment and thought content normal.  Nursing note and vitals reviewed.    ED Treatments / Results  Labs (all labs ordered are listed, but only abnormal results are displayed) Labs Reviewed - No data to display  EKG None  Radiology No results found.  Procedures .Foreign Body Removal Date/Time: 02/23/2018 3:06 AM Performed by: Albesa Seen, PA-C Authorized by: Albesa Seen, PA-C  Consent: Verbal consent obtained. Risks and benefits: risks, benefits and alternatives were discussed Consent given by: patient Patient understanding: patient states understanding of the procedure being performed Body area: ear Location details: right ear Patient cooperative: yes Localization method: magnification and visualized Removal mechanism: alligator forceps Complexity: simple 1 objects recovered. Objects recovered: cotton swab Post-procedure assessment: foreign body removed Patient tolerance: Patient tolerated the procedure well with no immediate complications   (including critical care time)  Medications Ordered in ED Medications - No data to display   Initial Impression / Assessment and Plan / ED Course  I have reviewed the triage vital signs and the nursing notes.  Pertinent labs & imaging results that were available during my care of the patient were reviewed by me and considered in my medical decision making (see chart for details).     Patient well-appearing and in no acute distress.  Piece of cotton swab occluding right external auditory canal.  This was removed with alligator forceps, patient reports immediate resolution of symptoms.  Post removal, the right tympanic membrane was intact, but slightly  erythematous.  Patient given return precautions for any drainage, loss of hearing, or new or worsening symptoms.  Blood pressure was significantly elevated on visit today.  Patient is denying any headaches, blurred vision or other visual disturbance, chest pain, shortness of breath, confusion, dizziness or lightheadedness.  Therefore, do not suspect hypertensive urgency or emergency.  Patient is on lisinopril, but reports that he has been unable to tolerate recently due to cough, and is attempting to get into his primary care provider for a change of prescription.  I stressed the importance of this with the patient. I discussed with the patient that I will send a message in the electronic medical record to his primary care provider with blood pressure readings.  Patient is in understanding and agrees with the plan of care.  Final Clinical Impressions(s) / ED Diagnoses   Final diagnoses:  Foreign body of right middle ear, initial encounter  Elevated blood pressure reading with diagnosis of hypertension    ED Discharge Orders    None       Tamala Julian 69/67/89 [id number]    Delora Fuel, MD 17/51/02 [phone]

## 2018-02-23 NOTE — Discharge Instructions (Signed)
Please see the information and instructions below regarding your visit.  Your diagnoses today include:  1. Foreign body of right middle ear, initial encounter   2. Elevated blood pressure reading with diagnosis of hypertension     Tests performed today include: See side panel of your discharge paperwork for testing performed today. Vital signs are listed at the bottom of these instructions.   Medications prescribed:    Take any prescribed medications only as prescribed, and any over the counter medications only as directed on the packaging.  Home care instructions:  Please follow any educational materials contained in this packet.   Follow-up instructions: Please follow-up with your primary care provider in one week for further evaluation of your blood pressure.  Return instructions:  Please return to the Emergency Department if you experience worsening symptoms.  Return to the emergency department if you have any decrease in hearing, drainage out of the ear, or increasing pain. Please return if you have any other emergent concerns.  Additional Information:   Your vital signs today were: BP (!) 183/115 Comment: Pt states he does not take Rx linsopril d/t SE   Pulse 63 Comment: Pt states he does not take Rx linsopril d/t SE   Temp 98.1 F (36.7 C)    Resp 16    SpO2 100% Comment: Pt states he does not take Rx linsopril d/t SE If your blood pressure (BP) was elevated on multiple readings during this visit above 130 for the top number or above 80 for the bottom number, please have this repeated by your primary care provider within one month. --------------  Thank you for allowing [place] to participate in your care today.

## 2018-08-05 ENCOUNTER — Encounter (HOSPITAL_COMMUNITY): Payer: Self-pay | Admitting: *Deleted

## 2018-08-05 ENCOUNTER — Emergency Department (HOSPITAL_COMMUNITY)
Admission: EM | Admit: 2018-08-05 | Discharge: 2018-08-06 | Disposition: A | Discharge status: Other Acute Inpatient Hospital | Payer: 59 | Attending: Medical | Admitting: Medical

## 2018-08-05 ENCOUNTER — Other Ambulatory Visit: Payer: Self-pay

## 2018-08-05 DIAGNOSIS — R45851 Suicidal ideations: Secondary | ICD-10-CM | POA: Insufficient documentation

## 2018-08-05 DIAGNOSIS — F329 Major depressive disorder, single episode, unspecified: Secondary | ICD-10-CM | POA: Diagnosis not present

## 2018-08-05 DIAGNOSIS — F17210 Nicotine dependence, cigarettes, uncomplicated: Secondary | ICD-10-CM | POA: Insufficient documentation

## 2018-08-05 DIAGNOSIS — Z79899 Other long term (current) drug therapy: Secondary | ICD-10-CM | POA: Diagnosis not present

## 2018-08-05 DIAGNOSIS — I10 Essential (primary) hypertension: Secondary | ICD-10-CM | POA: Diagnosis not present

## 2018-08-05 DIAGNOSIS — F32A Depression, unspecified: Secondary | ICD-10-CM

## 2018-08-05 LAB — CBC
HEMATOCRIT: 46.4 % (ref 39.0–52.0)
HEMOGLOBIN: 14.9 g/dL (ref 13.0–17.0)
MCH: 27.6 pg (ref 26.0–34.0)
MCHC: 32.1 g/dL (ref 30.0–36.0)
MCV: 86.1 fL (ref 80.0–100.0)
NRBC: 0 % (ref 0.0–0.2)
Platelets: 258 10*3/uL (ref 150–400)
RBC: 5.39 MIL/uL (ref 4.22–5.81)
RDW: 14.9 % (ref 11.5–15.5)
WBC: 17 10*3/uL — ABNORMAL HIGH (ref 4.0–10.5)

## 2018-08-05 LAB — COMPREHENSIVE METABOLIC PANEL
ALBUMIN: 4.7 g/dL (ref 3.5–5.0)
ALT: 23 U/L (ref 0–44)
AST: 28 U/L (ref 15–41)
Alkaline Phosphatase: 50 U/L (ref 38–126)
Anion gap: 16 — ABNORMAL HIGH (ref 5–15)
BILIRUBIN TOTAL: 0.3 mg/dL (ref 0.3–1.2)
BUN: 14 mg/dL (ref 6–20)
CO2: 22 mmol/L (ref 22–32)
CREATININE: 0.97 mg/dL (ref 0.61–1.24)
Calcium: 9 mg/dL (ref 8.9–10.3)
Chloride: 106 mmol/L (ref 98–111)
GFR calc Af Amer: >60 mL/min (ref >60)
GLUCOSE: 93 mg/dL (ref 70–99)
Potassium: 3 mmol/L — ABNORMAL LOW (ref 3.5–5.1)
Sodium: 144 mmol/L (ref 135–145)
TOTAL PROTEIN: 7.6 g/dL (ref 6.5–8.1)

## 2018-08-05 LAB — ACETAMINOPHEN LEVEL: Acetaminophen (Tylenol), Serum: <10 ug/mL — ABNORMAL LOW (ref 10–30)

## 2018-08-05 LAB — RAPID URINE DRUG SCREEN, HOSP PERFORMED
AMPHETAMINES: NOT DETECTED
BARBITURATES: NOT DETECTED
BENZODIAZEPINES: NOT DETECTED
Cocaine: POSITIVE — AB
Opiates: NOT DETECTED
TETRAHYDROCANNABINOL: POSITIVE — AB

## 2018-08-05 LAB — ETHANOL: ALCOHOL ETHYL (B): 294 mg/dL — AB (ref <10)

## 2018-08-05 LAB — SALICYLATE LEVEL: Salicylate Lvl: <7 mg/dL (ref 2.8–30.0)

## 2018-08-05 MED ORDER — POTASSIUM CHLORIDE CRYS ER 20 MEQ PO TBCR
40.0000 meq | EXTENDED_RELEASE_TABLET | Freq: Once | ORAL | Status: AC
  Administered 2018-08-05: 40 meq via ORAL
  Filled 2018-08-05: qty 2

## 2018-08-05 MED ORDER — HYOSCYAMINE SULFATE 0.125 MG PO TABS
0.1250 mg | ORAL_TABLET | Freq: Four times a day (QID) | ORAL | Status: DC | PRN
  Filled 2018-08-05: qty 1

## 2018-08-05 MED ORDER — AMLODIPINE BESYLATE 5 MG PO TABS
10.0000 mg | ORAL_TABLET | Freq: Every day | ORAL | Status: DC
  Administered 2018-08-05: 10 mg via ORAL
  Filled 2018-08-05: qty 2

## 2018-08-05 NOTE — ED Provider Notes (Signed)
[facility] DEPT Provider Note   CSN: [id number] Arrival date & time: 08/05/18  1753     History   Chief Complaint Chief Complaint  Patient presents with  . Medical Clearance    HPI Timothy Stewart is a 47 y.o. male.  Patient reportedly was feeling suicidal earlier today and his friend contacted the police who brought the patient to the emergency department.  He is not on an IVC.  He says he has changed his mind and he does not feel suicidal anymore.  He has been drinking today.  He does not want to tell me about the situation of why he was feeling this way today.  He denies ever trying to hurt himself in the past.  The history is provided by the patient.  Mental Health Problem  Presenting symptoms: suicidal thoughts   Patient accompanied by:  Law enforcement Onset quality:  Sudden Progression:  Resolved Chronicity:  New Context: alcohol use   Treatment compliance:  Untreated Relieved by: police intervention. Associated symptoms: no abdominal pain, no chest pain and no headaches     Past Medical History:  Diagnosis Date  . Allergic reaction   . Hypertension     There are no active problems to display for this patient.   Past Surgical History:  Procedure Laterality Date  . ABDOMINAL SURGERY    . BACK SURGERY          Home Medications    Prior to Admission medications   Medication Sig Start Date End Date Taking? Authorizing Provider  EPINEPHrine (EPIPEN 2-PAK) 0.3 mg/0.3 mL SOAJ injection Inject 0.3 mLs (0.3 mg total) into the muscle once as needed (for severe allergic reaction). CAll 911 immediately if you have to use this medicine 11/26/13   Montine Circle, PA-C  HYDROcodone-acetaminophen (NORCO/VICODIN) 5-325 MG per tablet Take 1-2 tablets by mouth every 6 (six) hours as needed for moderate pain.    [provider]  meloxicam (MOBIC) 15 MG tablet Take 15 mg by mouth daily.    [provider]  predniSONE  (DELTASONE) 20 MG tablet Take 2 tablets (40 mg total) by mouth daily. 11/26/13   Montine Circle, PA-C  PRESCRIPTION MEDICATION Steroid injection    [provider]  traMADol (ULTRAM) 50 MG tablet Take 50-100 mg by mouth every 6 (six) hours as needed for moderate pain.    [provider]    Family History No family history on file.  Social History Social History   Tobacco Use  . Smoking status: Current Every Day Smoker  . Smokeless tobacco: Never Used  Substance Use Topics  . Alcohol use: Yes  . Drug use: Yes    Types: Marijuana     Allergies   Codeine   Review of Systems Review of Systems  Constitutional: Negative for fever.  HENT: Negative for sore throat.   Eyes: Negative for visual disturbance.  Respiratory: Negative for shortness of breath.   Cardiovascular: Negative for chest pain.  Gastrointestinal: Negative for abdominal pain.  Genitourinary: Negative for dysuria.  Musculoskeletal: Negative for neck pain.  Skin: Negative for rash.  Neurological: Negative for headaches.  Psychiatric/Behavioral: Positive for suicidal ideas.     Physical Exam Updated Vital Signs BP (!) 153/86 (BP Location: Left Arm)   Pulse (!) 110   Temp 97.8 F (36.6 C) (Oral)   Resp 20   Ht 5\' 11"  (1.803 m)   Wt 81.6 kg   SpO2 100%   BMI 25.10 kg/m  Physical Exam  Constitutional: He appears well-developed and well-nourished.  HENT:  Head: Normocephalic and atraumatic.  Eyes: Pupils are equal, round, and reactive to light. Right conjunctiva is injected. Left conjunctiva is injected.  Neck: Neck supple.  Cardiovascular: Regular rhythm. Tachycardia present.  No murmur heard. Pulmonary/Chest: Effort normal and breath sounds normal. No respiratory distress.  Abdominal: Soft. There is no tenderness.  Musculoskeletal: He exhibits no edema or deformity.  Neurological: He is alert.  Skin: Skin is warm and dry.  Psychiatric: He has a normal mood and affect.  Nursing  note and vitals reviewed.    ED Treatments / Results  Labs (all labs ordered are listed, but only abnormal results are displayed) Labs Reviewed  COMPREHENSIVE METABOLIC PANEL - Abnormal; Notable for the following components:      Result Value   Potassium 3.0 (*)    Anion gap 16 (*)    All other components within normal limits  ETHANOL - Abnormal; Notable for the following components:   Alcohol, Ethyl (B) 294 (*)    All other components within normal limits  ACETAMINOPHEN LEVEL - Abnormal; Notable for the following components:   Acetaminophen (Tylenol), Serum <10 (*)    All other components within normal limits  CBC - Abnormal; Notable for the following components:   WBC 17.0 (*)    All other components within normal limits  RAPID URINE DRUG SCREEN, HOSP PERFORMED - Abnormal; Notable for the following components:   Cocaine POSITIVE (*)    Tetrahydrocannabinol POSITIVE (*)    All other components within normal limits  SALICYLATE LEVEL    EKG None  Radiology No results found.  Procedures Procedures (including critical care time)  Medications Ordered in ED Medications - No data to display   Initial Impression / Assessment and Plan / ED Course  I have reviewed the triage vital signs and the nursing notes.  Pertinent labs & imaging results that were available during my care of the patient were reviewed by me and considered in my medical decision making (see chart for details).  Clinical Course as of Aug 06 1248  Sat Aug 05, 2018  2201 Received a call from the nurse in the TCU that behavioral health feels the patient should be IVC.   [MB]    Clinical Course User Index [MB] Hayden Rasmussen, MD      Final Clinical Impressions(s) / ED Diagnoses   Final diagnoses:  Depression, unspecified depression type  Suicidal ideation    ED Discharge Orders    None       Hayden Rasmussen, MD 08/06/18 1250

## 2018-08-05 NOTE — BH Assessment (Addendum)
Tele Assessment Note   Patient Name: Timothy Stewart MRN: [id number] Referring Physician: Dr. Aletta Edouard, MD Location of Patient: [facility] Location of Provider: [facility] Department  Timothy Stewart is a 47 y.o. male who was brought to [facility] by the [employer] due to concerns by his friend and his family about his planning to end his life. Pt was hesitant to provide information, stating, "look at me, do I look like someone who would do that?" numerous times throughout the assessment, but it was reported that [employer] found an extension cord in pt's shower and pt planned to hang himself. Pt answered few of clinician's questions, though he did verify that he and his wife are going through a separation after 29 years together and that the last 50 days have been very difficult for him. Pt denied SI, though he acknowledged that the fact that he's had thoughts about SI in the past was a "possibility." Pt denied HI, AVH, and NSSIB. Pt showed clinician his tattoo, which is of his nickname, though clinician cannot pronounce it, nor spell it, as it is supposed to represent a noise made in rap music. Pt expressed a desire to go home and stated he did not need to be in the hospital, as he was just having a hard time with his life changes. Pt apologized for being rude to clinician and refused to answer any additional questions.  Pt is oriented x4. From the questions answered, it appears that pt's recent and remote memory is intact, though this is based on limited information. Pt was irate and irritated throughout the assessment process. His insight, judgement, and impulse control are impaired at this time.  Diagnosis: F32., Major depressive disorder, Single episode, Severe   Past Medical History:  Past Medical History:  Diagnosis Date  . Allergic reaction   . Hypertension     Past Surgical History:  Procedure Laterality Date  . ABDOMINAL SURGERY    . BACK SURGERY      Family History: No family  history on file.  Social History:  reports that he has been smoking. He has never used smokeless tobacco. He reports that he drinks alcohol. He reports that he has current or past drug history. Drug: Marijuana.  Additional Social History:  Alcohol / Drug Use Pain Medications: Please see MAR Prescriptions: Please see MAR Over the Counter: Please see MAR History of alcohol / drug use?: (UTA) Longest period of sobriety (when/how long): UTA  CIWA: CIWA-Ar BP: 129/80 Pulse Rate: 89 COWS:    Allergies:  Allergies  Allergen Reactions  . Codeine Rash    Home Medications:  (Not in a hospital admission)  OB/GYN Status:  No LMP for male patient.  General Assessment Data Location of Assessment: [facility] ED TTS Assessment: In system Is this a Tele or Face-to-Face Assessment?: Tele Assessment Is this an Initial Assessment or a Re-assessment for this encounter?: Initial Assessment Patient Accompanied by:: N/A Language Other than English: No Living Arrangements: (Pt lives independently in his own home) What gender do you identify as?: Male Marital status: Separated Maiden name: Maute Pregnancy Status: No Living Arrangements: Alone(Pt is currently separated) Can pt return to current living arrangement?: Yes Admission Status: Involuntary Petitioner: Other(Pt was IVCed by his best friend) Is patient capable of signing voluntary admission?: Yes Referral Source: Self/Family/Friend Insurance type: [place] Living Arrangements: Alone(Pt is currently separated) Legal Guardian: (N/A) Name of Psychiatrist: [place] Name of Therapist: UTA  Education Status Is patient currently in school?: (UTA)  Risk to self with the past 6 months Suicidal Ideation: Yes-Currently Present Suicidal Intent: Yes-Currently Present Has patient had any suicidal intent within the past 6 months prior to admission? : Yes Is patient at risk for suicide?: Yes Suicidal Plan?: Yes-Currently  Present Has patient had any suicidal plan within the past 6 months prior to admission? : Yes Specify Current Suicidal Plan: Pt had an extention cord hanging in the shower Access to Means: Yes Specify Access to Suicidal Means: Pt had an extention cord hanging in the shower What has been your use of drugs/alcohol within the last 12 months?: UTA Previous Attempts/Gestures: (UTA) How many times?: (UTA) Other Self Harm Risks: UTA Triggers for Past Attempts: Other (Comment)(UTA) Intentional Self Injurious Behavior: None Family Suicide History: Unable to assess Recent stressful life event(s): Loss (Comment)(Pt and his wife (together 29 years) have separated) Persecutory voices/beliefs?: No Depression: Yes Depression Symptoms: Isolating, Loss of interest in usual pleasures, Feeling worthless/self pity, Feeling angry/irritable, Despondent Substance abuse history and/or treatment for substance abuse?: (UTA) Suicide prevention information given to non-admitted patients: Not applicable  Risk to Others within the past 6 months Homicidal Ideation: No Does patient have any lifetime risk of violence toward others beyond the six months prior to admission? : (UTA, though pt denied) Thoughts of Harm to Others: (UTA, though pt denied) Current Homicidal Intent: (UTA, though pt denied) Current Homicidal Plan: (UTA) Access to Homicidal Means: (UTA) Identified Victim: UTA History of harm to others?: (UTA) Assessment of Violence: On admission Violent Behavior Description: UTA Does patient have access to weapons?: (UTA) Criminal Charges Pending?: (UTA) Does patient have a court date: (UTA) Is patient on probation?: Unknown  Psychosis Hallucinations: None noted Delusions: None noted  Mental Status Report Appearance/Hygiene: Bizarre, In scrubs Eye Contact: Poor Motor Activity: Agitation(Pt would lie in his bed covered up, move, then cover back up) Speech: Argumentative, Elective mutism, Other  (Comment)(Questioning) Level of Consciousness: Irritable Mood: Anxious, Apprehensive, Irritable Affect: Irritable, Anxious Anxiety Level: Minimal Thought Processes: Circumstantial Judgement: Impaired Orientation: Person, Time, Place, Situation Obsessive Compulsive Thoughts/Behaviors: Minimal  Cognitive Functioning Concentration: Decreased Memory: Unable to Assess Is patient IDD: No Insight: Poor Impulse Control: Poor Appetite: (UTA) Have you had any weight changes? : (UTA) Sleep: Unable to Assess Total Hours of Sleep: (UTA) Vegetative Symptoms: Unable to Assess  ADLScreening [facility] Assessment Services) Patient's cognitive ability adequate to safely complete daily activities?: (UTA) Patient able to express need for assistance with ADLs?: (UTA) Independently performs ADLs?: (UTA)  Prior Inpatient Therapy Prior Inpatient Therapy: (UTA)  Prior Outpatient Therapy Prior Outpatient Therapy: (UTA)  ADL Screening (condition at time of admission) Patient's cognitive ability adequate to safely complete daily activities?: (UTA) Is the patient deaf or have difficulty hearing?: (UTA) Does the patient have difficulty seeing, even when wearing glasses/contacts?: (UTA) Does the patient have difficulty concentrating, remembering, or making decisions?: (UTA) Patient able to express need for assistance with ADLs?: (UTA) Does the patient have difficulty dressing or bathing?: (UTA) Independently performs ADLs?: (UTA) Does the patient have difficulty walking or climbing stairs?: (UTA) Weakness of Legs: (UTA) Weakness of Arms/Hands: (UTA)     Therapy Consults (therapy consults require a physician order) PT Evaluation Needed: (UTA) OT Evalulation Needed: (UTA) SLP Evaluation Needed: (UTA) Abuse/Neglect Assessment (Assessment to be complete while patient is alone) Abuse/Neglect Assessment Can Be Completed: Unable to assess, patient is non-responsive or altered mental status Values /  Beliefs Cultural Requests During Hospitalization: (UTA) Spiritual Requests  During Hospitalization: (UTA) [place] Needed: (UTA) Social Work Consult Needed: Special educational needs teacher) Regulatory affairs officer ([place]) Does Patient Have a Medical Advance Directive?: No Would patient like information on creating a medical advance directive?: No - Patient declined       Disposition: Lindon Romp NP reviewed pt's chart and information and determined pt meets criteria for inpatient hospitalization. Pt has been accepted by [facility] and will be placed in Room 305-1; he will be accepted at 0000 (midnight).   Disposition Initial Assessment Completed for this Encounter: Yes Patient referred to: Other (Comment)(Pt has been accepted at [facility] Room 305-1)  This service was provided via telemedicine using a 2-way, interactive audio and video technology.  Names of all persons participating in this telemedicine service and their role in this encounter. Name: Cletis [place] Role: Patient  Name: Windell Hummingbird Role: Clinician    Dannielle Burn 08/05/2018 10:20 PM

## 2018-08-05 NOTE — ED Triage Notes (Signed)
Pt brought in by [employer], not IVC'd. Pt's bestfriend called [employer] to bring pt to hospital. Pt states he is going through some things and feels he needs to end his life. After talking with [employer] he no longer feels SI. Had plan to hang self with drop cord which was hanging in shower.

## 2018-08-06 ENCOUNTER — Other Ambulatory Visit: Payer: Self-pay

## 2018-08-06 ENCOUNTER — Inpatient Hospital Stay (HOSPITAL_COMMUNITY)
Admission: AD | Admit: 2018-08-06 | Discharge: 2018-08-07 | DRG: 885 | Disposition: A | Discharge status: Home / Self Care | Payer: 59 | Source: Intra-hospital | Attending: Psychiatry | Admitting: Psychiatry

## 2018-08-06 ENCOUNTER — Encounter (HOSPITAL_COMMUNITY): Payer: Self-pay

## 2018-08-06 DIAGNOSIS — Y908 Blood alcohol level of 240 mg/100 ml or more: Secondary | ICD-10-CM | POA: Diagnosis present

## 2018-08-06 DIAGNOSIS — F419 Anxiety disorder, unspecified: Secondary | ICD-10-CM | POA: Diagnosis present

## 2018-08-06 DIAGNOSIS — F332 Major depressive disorder, recurrent severe without psychotic features: Secondary | ICD-10-CM | POA: Diagnosis present

## 2018-08-06 DIAGNOSIS — R45851 Suicidal ideations: Secondary | ICD-10-CM | POA: Diagnosis present

## 2018-08-06 DIAGNOSIS — F10229 Alcohol dependence with intoxication, unspecified: Secondary | ICD-10-CM | POA: Diagnosis present

## 2018-08-06 DIAGNOSIS — G4710 Hypersomnia, unspecified: Secondary | ICD-10-CM | POA: Diagnosis present

## 2018-08-06 DIAGNOSIS — I10 Essential (primary) hypertension: Secondary | ICD-10-CM | POA: Diagnosis present

## 2018-08-06 DIAGNOSIS — F17210 Nicotine dependence, cigarettes, uncomplicated: Secondary | ICD-10-CM | POA: Diagnosis present

## 2018-08-06 DIAGNOSIS — F1020 Alcohol dependence, uncomplicated: Secondary | ICD-10-CM | POA: Diagnosis present

## 2018-08-06 MED ORDER — LORAZEPAM 1 MG PO TABS: 1.0000 mg | ORAL_TABLET | Freq: Three times a day (TID) | ORAL | Status: DC

## 2018-08-06 MED ORDER — ONDANSETRON 4 MG PO TBDP: 4.0000 mg | ORAL_TABLET | Freq: Four times a day (QID) | ORAL | Status: DC | PRN

## 2018-08-06 MED ORDER — ALUM & MAG HYDROXIDE-SIMETH 200-200-20 MG/5ML PO SUSP: 30.0000 mL | ORAL | Status: DC | PRN

## 2018-08-06 MED ORDER — VITAMIN B-1 100 MG PO TABS
100.0000 mg | ORAL_TABLET | Freq: Every day | ORAL | Status: DC
  Administered 2018-08-06 – 2018-08-07 (×2): 100 mg via ORAL
  Filled 2018-08-06 (×3): qty 1

## 2018-08-06 MED ORDER — INFLUENZA VAC SPLIT QUAD 0.5 ML IM SUSY
0.5000 mL | PREFILLED_SYRINGE | INTRAMUSCULAR | Status: DC
  Filled 2018-08-06: qty 0.5

## 2018-08-06 MED ORDER — TRAZODONE HCL 50 MG PO TABS
50.0000 mg | ORAL_TABLET | Freq: Every evening | ORAL | Status: DC | PRN
  Filled 2018-08-06: qty 1

## 2018-08-06 MED ORDER — ACETAMINOPHEN 325 MG PO TABS: 650.0000 mg | ORAL_TABLET | Freq: Four times a day (QID) | ORAL | Status: DC | PRN

## 2018-08-06 MED ORDER — HYOSCYAMINE SULFATE 0.125 MG PO TABS
0.1250 mg | ORAL_TABLET | Freq: Four times a day (QID) | ORAL | Status: DC | PRN
  Filled 2018-08-06: qty 1

## 2018-08-06 MED ORDER — LORAZEPAM 1 MG PO TABS: 1.0000 mg | ORAL_TABLET | Freq: Two times a day (BID) | ORAL | Status: DC

## 2018-08-06 MED ORDER — LORAZEPAM 1 MG PO TABS
1.0000 mg | ORAL_TABLET | Freq: Four times a day (QID) | ORAL | Status: DC | PRN
  Filled 2018-08-06: qty 1

## 2018-08-06 MED ORDER — AMLODIPINE BESYLATE 10 MG PO TABS
10.0000 mg | ORAL_TABLET | Freq: Every day | ORAL | Status: DC
  Administered 2018-08-06 – 2018-08-07 (×2): 10 mg via ORAL
  Filled 2018-08-06 (×4): qty 1

## 2018-08-06 MED ORDER — LORAZEPAM 1 MG PO TABS: 1.0000 mg | ORAL_TABLET | Freq: Every day | ORAL | Status: DC

## 2018-08-06 MED ORDER — POTASSIUM CHLORIDE CRYS ER 20 MEQ PO TBCR
20.0000 meq | EXTENDED_RELEASE_TABLET | Freq: Two times a day (BID) | ORAL | Status: DC
  Administered 2018-08-06 – 2018-08-07 (×2): 20 meq via ORAL
  Filled 2018-08-06 (×5): qty 1

## 2018-08-06 MED ORDER — HYDROXYZINE HCL 25 MG PO TABS: 25.0000 mg | ORAL_TABLET | Freq: Four times a day (QID) | ORAL | Status: DC | PRN

## 2018-08-06 MED ORDER — MAGNESIUM HYDROXIDE 400 MG/5ML PO SUSP: 30.0000 mL | Freq: Every day | ORAL | Status: DC | PRN

## 2018-08-06 MED ORDER — ADULT MULTIVITAMIN W/MINERALS CH
1.0000 | ORAL_TABLET | Freq: Every day | ORAL | Status: DC
  Administered 2018-08-06 – 2018-08-07 (×2): 1 via ORAL
  Filled 2018-08-06 (×4): qty 1

## 2018-08-06 MED ORDER — FLUOXETINE HCL 10 MG PO CAPS
10.0000 mg | ORAL_CAPSULE | Freq: Every day | ORAL | Status: DC
  Administered 2018-08-06 – 2018-08-07 (×2): 10 mg via ORAL
  Filled 2018-08-06 (×3): qty 1

## 2018-08-06 MED ORDER — LORAZEPAM 1 MG PO TABS
1.0000 mg | ORAL_TABLET | Freq: Four times a day (QID) | ORAL | Status: DC
  Administered 2018-08-06 (×2): 1 mg via ORAL
  Filled 2018-08-06 (×2): qty 1

## 2018-08-06 MED ORDER — LOPERAMIDE HCL 2 MG PO CAPS: 2.0000 mg | ORAL_CAPSULE | ORAL | Status: DC | PRN

## 2018-08-06 NOTE — [facility] Group Notes (Signed)
[facility] Group Notes:  (Nursing/MHT/Case Management/Adjunct)  Date:  08/06/2018  Time:  3:48 PM  Type of Therapy:  Nurse Education  Participation Level:  Active  Participation Quality:  Appropriate and Attentive  Affect:  Appropriate  Cognitive:  Alert and Appropriate  Insight:  Good  Engagement in Group:  Engaged  Modes of Intervention:  Activity and Education  Summary of Progress/Problems: In this group, the nurse discussed positive self-image, positive self-talk. The nurse also covered how to change automatic negative thoughts and improving self-narrative.  [name] 08/06/2018, 3:48 PM
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
## 2018-08-06 NOTE — Progress Notes (Signed)
Patient did not attend the evening speaker [place] meeting. Pt was asleep during group time and did not wake when this writer notified him that group was beginning.

## 2018-08-06 NOTE — BHH Group Notes (Signed)
LCSW Group Therapy Note  08/06/2018    10:00 - 11:00 AM               Type of Therapy and Topic:  Group Therapy: Well-being  Participation Level:  Active   Description of Group:   In this group, patients started with an ice breaker talking about their favorite type of pie and the ingredients needed to make it. CSW asked patients what happens if we are missing one thing we need like the pan or the flour. CSW related this to our well-being and created a well-being pie. Patients learned how to define well-being as well as the parts that include: physical, mental, emotional and spiritual. They were asked to identify why each part is important and provide examples for each section. Patients engaged in group discussion about these concepts. Patients were provided a self-assessment to explore their own well-being and areas that they may need to make a change in. Patients shared results as comfortable doing so. Patients were provided a handout with a list of ways to improve our well-being and to cope better with life. Patients were asked to read these out loud and share thoughts. CSW explained our circle could also be a flat tire because like missing an ingredient the flat tire makes it difficult to steer, the ride bumpy, could cause a wreck or keep [place] from reaching our destination. CSW placed an emphasis on therapy, doctor visits and taking medications.   Therapeutic Goals: 1. Patients will learn the four main areas of our well-being and examples of each. 2. Patients will complete an assessment to reflect on their own needs. 3. Patients will discuss how they can meet the needs. 4. Patients will be asked to read out loud some positive ways to improve our overall well-being.  5. Patients will be encouraged to identify one area that needs improvement and create a plan on how to achieve this.    Summary of Patient Progress:  Patient was engaged and participated throughout the group session. Patient  expressed how important spiritual wellbeing is to him to get through things. Patient reports his goal is to stay more focused on things when he discharges.    Therapeutic Modalities:   Cognitive Behavioral Therapy Motivational Interviewing  Brief Therapy  Tye Savoy, LCSW  08/06/2018 3:05 PM

## 2018-08-06 NOTE — Tx Team (Signed)
Initial Treatment Plan 08/06/2018 4:57 AM [name] DOD:[id number]    PATIENT STRESSORS: Marital or family conflict Substance abuse   PATIENT STRENGTHS: Capable of independent living Communication skills Supportive family/friends   PATIENT IDENTIFIED PROBLEMS: "I want to be better"  "I want to go home"  Risk for suicide  Substance Abuse  Depression  Ineffective individual coping           DISCHARGE CRITERIA:  Verbal commitment to aftercare and medication compliance Withdrawal symptoms are absent or subacute and managed without 24-hour nursing intervention  PRELIMINARY DISCHARGE PLAN: Return to previous living arrangement  PATIENT/FAMILY INVOLVEMENT: This treatment plan has been presented to and reviewed with the patient, Timothy Stewart, and/or family member. The patient and family have been given the opportunity to ask questions and make suggestions.  Leia Alf, RN 08/06/2018, 4:57 AM
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
## 2018-08-06 NOTE — Progress Notes (Signed)
D.  Pt in bed on approach, pleasant affect.  Pt denies complaints at this time.  Pt did not get up to attend evening AA group.  Pt has remained in bed thus far this shift.  Pt denies SI/HI/AVH at this time, reports no signs or symptoms of withdrawal.  A.  Support and encouragement offered  R.  Pt remains safe on the unit, will continue to monitor.

## 2018-08-06 NOTE — Plan of Care (Signed)
D: Patient presents irritable, guarded. He reports sleeping well last night, and did not request medication to help. His appetite is good, energy normal and concentration good. He denies withdrawal symptoms or physical complaints. His mood is good, and he rates his depression and hopelessness 0/10. His anxiety is 2/10. Patient denies SI/HI/AVH.  A: Patient checked q15 min, and checks reviewed. Reviewed medication changes with patient and educated on side effects. Educated patient on importance of attending group therapy sessions and educated on several coping skills. Encouarged participation in milieu through recreation therapy and attending meals with peers. Support and encouragement provided. Fluids offered. R: Patient receptive to education on medications, and is medication compliant. Patient contracts for safety on the unit. Goal: "Going home and rebuilding my marriage."

## 2018-08-06 NOTE — Progress Notes (Signed)
Patient ID: Timothy Stewart, male   DOB: 21-Jul-1971, 47 y.o.   MRN: [id number] Admission Notes  Pt is a 47 y/o male admitted onto the 300 I/P adult unit. Pt refused to answer most of the questions initially; later answer yes or no questions. Pt came on to the unit after a suicide attempt by hanging (per report from the [facility]). Pt refused to talk about the reason(s) for his attempt; "I just don't want to talk about it." At admission, Pt denied any form of depression, anxiety, AVH, SI, and HI. He states, "I just need to go home; I shouldn't be here." Pt UDS was positive for cocaine, THC and a BAL 294. Pt refused to answer alcohol questioner; "I don't drink that much." Support, encouragement, and safe environment provided.  15-minute safety checks initiated and continued. Pt remained alert and oriented through the admission process. Pt searched and no contrabands found.

## 2018-08-07 DIAGNOSIS — F419 Anxiety disorder, unspecified: Secondary | ICD-10-CM

## 2018-08-07 MED ORDER — FLUOXETINE HCL 20 MG PO CAPS: 20.0000 mg | ORAL_CAPSULE | Freq: Every day | ORAL | 0 refills | Status: DC

## 2018-08-07 MED ORDER — HYDROXYZINE HCL 50 MG PO TABS: 50.0000 mg | ORAL_TABLET | Freq: Four times a day (QID) | ORAL | 0 refills | Status: DC | PRN

## 2018-08-07 MED ORDER — HYDROXYZINE HCL 50 MG PO TABS: 50.0000 mg | ORAL_TABLET | Freq: Four times a day (QID) | ORAL | Status: DC | PRN

## 2018-08-07 MED ORDER — FLUOXETINE HCL 20 MG PO CAPS
20.0000 mg | ORAL_CAPSULE | Freq: Every day | ORAL | Status: DC
  Filled 2018-08-07 (×2): qty 1

## 2018-08-07 NOTE — Plan of Care (Addendum)
Patient self inventory- Patient slept well last night, sleep medication was not requested. Appetite has been good, energy level normal, concentration good. Depression, hopelessness, and anxiety all rated 0/10. Denies SI HI AVH. Denies physical pain. Patient's goal is "going home and staying focused on all what's most important by participating in group and think positive always."  Patient is compliant with medications prescribed, but did tell writer he will not continue Prozac after discharge.  Safety is maintained with 15 minute checks as well as environmental checks. Will continue to monitor.  Problem: Education: Goal: Utilization of techniques to improve thought processes will improve Outcome: Progressing Goal: Knowledge of the prescribed therapeutic regimen will improve Outcome: Progressing   Problem: Health Behavior/Discharge Planning: Goal: Compliance with therapeutic regimen will improve Outcome: Progressing   Problem: Coping: Goal: Coping ability will improve Outcome: Not Progressing Goal: Will verbalize feelings Outcome: Not Progressing

## 2018-08-07 NOTE — Tx Team (Signed)
Interdisciplinary Treatment and Diagnostic Plan Update  08/07/2018 Time of Session: [id number] Timothy Stewart MRN: [id number]  Principal Diagnosis: Severe recurrent major depression without psychotic features [facility])  Secondary Diagnoses: Principal Problem:   Severe recurrent major depression without psychotic features ([place]) Active Problems:   Alcohol dependence ([place])   Current Medications:  Current Facility-Administered Medications  Medication Dose Route Frequency Provider Last Rate Last Dose  . acetaminophen (TYLENOL) tablet 650 mg  650 mg Oral Q6H PRN Lindon Romp A, NP      . alum & mag hydroxide-simeth (MAALOX/MYLANTA) 200-200-20 MG/5ML suspension 30 mL  30 mL Oral Q4H PRN Lindon Romp A, NP      . amLODipine (NORVASC) tablet 10 mg  10 mg Oral Daily Lindon Romp A, NP   10 mg at 08/07/18 0743  . [START ON 08/08/2018] FLUoxetine (PROZAC) capsule 20 mg  20 mg Oral Daily [place], Christopher T, MD      . hydrOXYzine (ATARAX/VISTARIL) tablet 50 mg  50 mg Oral Q6H PRN Pennelope Bracken, MD      . hyoscyamine (LEVSIN, ANASPAZ) tablet 0.125 mg  0.125 mg Oral Q6H PRN Rozetta Nunnery, NP      . Influenza vac split quadrivalent PF (FLUARIX) injection 0.5 mL  0.5 mL Intramuscular Tomorrow-1000 Cobos, Fernando A, MD      . loperamide (IMODIUM) capsule 2-4 mg  2-4 mg Oral PRN Lindon Romp A, NP      . LORazepam (ATIVAN) tablet 1 mg  1 mg Oral Q6H PRN Lindon Romp A, NP      . magnesium hydroxide (MILK OF MAGNESIA) suspension 30 mL  30 mL Oral Daily PRN Lindon Romp A, NP      . multivitamin with minerals tablet 1 tablet  1 tablet Oral Daily Lindon Romp A, NP   1 tablet at 08/07/18 0743  . ondansetron (ZOFRAN-ODT) disintegrating tablet 4 mg  4 mg Oral Q6H PRN Lindon Romp A, NP      . potassium chloride SA (K-DUR,KLOR-CON) CR tablet 20 mEq  20 mEq Oral BID Cobos, Myer Peer, MD   20 mEq at 08/07/18 0743  . thiamine (VITAMIN B-1) tablet 100 mg  100 mg Oral Daily Lindon Romp A, NP   100 mg at  08/07/18 0743  . traZODone (DESYREL) tablet 50 mg  50 mg Oral QHS PRN Rozetta Nunnery, NP       PTA Medications: Medications Prior to Admission  Medication Sig Dispense Refill Last Dose  . amLODipine (NORVASC) 10 MG tablet Take 10 mg by mouth daily.  1 08/05/2018 at Unknown time  . dicyclomine (BENTYL) 20 MG tablet Take 20 mg by mouth 4 (four) times daily as needed for pain.  0 08/04/2018 at Unknown time  . EPINEPHrine (EPIPEN 2-PAK) 0.3 mg/0.3 mL SOAJ injection Inject 0.3 mLs (0.3 mg total) into the muscle once as needed (for severe allergic reaction). CAll 911 immediately if you have to use this medicine 1 Device 1 Past Month at Unknown time  . hyoscyamine (LEVSIN, ANASPAZ) 0.125 MG tablet Take 0.125 mg by mouth every 6 (six) hours as needed for spasms.  0 08/05/2018 at Unknown time  . predniSONE (DELTASONE) 20 MG tablet Take 2 tablets (40 mg total) by mouth daily. (Patient not taking: Reported on 08/05/2018) 10 tablet 0 Not Taking at Unknown time  . tadalafil (ADCIRCA/CIALIS) 20 MG tablet Take 20 mg by mouth as directed. 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   Physician Treatment Plan for Primary Diagnosis: Severe recurrent major depression without psychotic features ([place]) Long Term Goal(s): Improvement in symptoms so as ready for discharge Improvement in symptoms so as ready for discharge   Short Term Goals: Ability to identify changes in lifestyle to reduce recurrence of condition will improve Ability to verbalize feelings will improve Ability to disclose and discuss suicidal ideas Ability to demonstrate self-control will improve Ability to identify and develop  effective coping behaviors will improve Ability to maintain clinical measurements within normal limits will improve Compliance with prescribed medications will improve Ability to identify triggers associated with substance abuse/mental health issues will improve  Medication Management: Evaluate patient's response, side effects, and tolerance of medication regimen.  Therapeutic Interventions: 1 to 1 sessions, Unit Group sessions and Medication administration.  Evaluation of Outcomes: Adequate for Discharge  Physician Treatment Plan for Secondary Diagnosis: Principal Problem:   Severe recurrent major depression without psychotic features ([place]) Active Problems:   Alcohol dependence ([place])  Long Term Goal(s): Improvement in symptoms so as ready for discharge Improvement in symptoms so as ready for discharge   Short Term Goals: Ability to identify changes in lifestyle to reduce recurrence of condition will improve Ability to verbalize feelings will improve Ability to disclose and discuss suicidal ideas Ability to demonstrate self-control will improve Ability to identify and develop effective coping behaviors will improve Ability to maintain clinical measurements within normal limits will improve Compliance with prescribed medications will improve Ability to identify triggers associated with substance abuse/mental health issues will improve     Medication Management: Evaluate patient's response, side effects, and tolerance of medication regimen.  Therapeutic Interventions: 1 to 1 sessions, Unit Group sessions and Medication administration.  Evaluation of Outcomes: Adequate for Discharge   RN Treatment Plan for Primary Diagnosis: Severe recurrent major depression without psychotic features ([place]) Long Term Goal(s): Knowledge of disease and therapeutic regimen to maintain health will improve  Short Term Goals: Ability to remain free from injury will improve, Ability to demonstrate  self-control, Ability to disclose and discuss suicidal ideas and Ability to identify and develop effective coping behaviors will improve  Medication Management: RN will administer medications as ordered by provider, will assess and evaluate patient's response and provide education to patient for prescribed medication. RN will report any adverse and/or side effects to prescribing provider.  Therapeutic Interventions: 1 on 1 counseling sessions, Psychoeducation, Medication administration, Evaluate responses to treatment, Monitor vital signs and CBGs as ordered, Perform/monitor CIWA, COWS, AIMS and Fall Risk screenings as ordered, Perform wound care treatments as ordered.  Evaluation of Outcomes: Adequate for Discharge   LCSW Treatment Plan for Primary Diagnosis: Severe recurrent major depression without psychotic features ([place]) Long Term Goal(s): Safe transition to appropriate next level of care at discharge, Engage patient in therapeutic group addressing interpersonal concerns.  Short Term Goals: Engage patient in aftercare planning with referrals and resources, Facilitate patient progression through stages of change regarding substance use diagnoses and concerns and Identify triggers associated with mental health/substance abuse issues  Therapeutic Interventions: Assess for all discharge needs, 1 to 1 time with Social worker, Explore available resources and support systems, Assess for adequacy in community support network, Educate family and significant other(s) on suicide prevention, Complete Psychosocial Assessment, Interpersonal group therapy.  Evaluation  of Outcomes: Adequate for Discharge   Progress in Treatment: Attending groups: Yes. Participating in groups: Yes. Taking medication as prescribed: Yes. Toleration medication: Yes. Family/Significant other contact made: SPE Completed with pt; pt declined to consent to collateral contact.  Patient understands diagnosis: Yes. Discussing  patient identified problems/goals with staff: Yes. Medical problems stabilized or resolved: Yes. Denies suicidal/homicidal ideation: Yes. Issues/concerns per patient self-inventory: No. Other: n/a   New problem(s) identified: No, Describe:  n/a  New Short Term/Long Term Goal(s): detox, medication management for mood stabilization; elimination of SI thoughts; development of comprehensive mental wellness/sobriety plan.   Patient Goals:  "learning better ways to cope with my depression and get started on medication."   Discharge Plan or Barriers: Pt plans to return home to his appt. He has follow-up in place at [facility] counseling for both medication management and therapy. [place] pamphlet, Mobile Crisis information, and AA/NA information provided to patient for additional community support and resources.   Reason for Continuation of Hospitalization: none  Estimated Length of Stay: today, 08/07/18  Attendees: Patient: 08/07/2018 11:35 AM  Physician: Dr. Maris Berger MD 08/07/2018 11:35 AM  Nursing: Yetta Flock RN; Estill Bamberg RN 08/07/2018 11:35 AM  RN Care Manager:x 08/07/2018 11:35 AM  Social Worker: Janice Norrie LCSW 08/07/2018 11:35 AM  Recreational Therapist:x  08/07/2018 11:35 AM  Other: Lindell Spar NP 08/07/2018 11:35 AM  Other:  08/07/2018 11:35 AM  Other: 08/07/2018 11:35 AM    Scribe for Treatment Team: Avelina Laine, LCSW 08/07/2018 11:35 AM

## 2018-08-07 NOTE — Progress Notes (Signed)
Patient signed a voluntary consent form.

## 2018-08-07 NOTE — BHH Suicide Risk Assessment (Signed)
[facility] Discharge Suicide Risk Assessment   Principal Problem: Severe recurrent major depression without psychotic features [facility]) Discharge Diagnoses:  Patient Active Problem List   Diagnosis Date Noted  . Severe recurrent major depression without psychotic features ([place]) [F33.2] 08/06/2018  . Alcohol dependence ([place]) [F10.20] 08/06/2018    Total Time spent with patient: 30 minutes  Musculoskeletal: Strength & Muscle Tone: within normal limits Gait & Station: normal Patient leans: N/A  Psychiatric Specialty Exam:   Blood pressure (!) 134/101, pulse 85, temperature 98.9 F (37.2 C), temperature source Oral, resp. rate 18, height 5\' 11"  (1.803 m), weight 76.2 kg.Body mass index is 23.43 kg/m.   Mental Status Per Nursing Assessment::   On Admission:  Suicidal ideation indicated by others  Demographic Factors:  Male, Divorced or widowed, Low socioeconomic status and Living alone  Loss Factors: Financial problems/change in socioeconomic status  Historical Factors: Impulsivity  Risk Reduction Factors:   Sense of responsibility to family, Positive social support, Positive therapeutic relationship and Positive coping skills or problem solving skills  Continued Clinical Symptoms:  Depression:   Comorbid alcohol abuse/dependence Alcohol/Substance Abuse/Dependencies  Cognitive Features That Contribute To Risk:  None    Suicide Risk:  Minimal: No identifiable suicidal ideation.  Patients presenting with no risk factors but with morbid ruminations; may be classified as minimal risk based on the severity of the depressive symptoms  Follow-up [place] Follow up.   Contact information: [address] [place] [id number] [phone]           Plan Of Care/Follow-up recommendations:  Activity:  as tolerated Diet:  normal Tests:  NA Other:  see above for [place], MD 08/07/2018, 11:33 AM

## 2018-08-07 NOTE — BHH Suicide Risk Assessment (Signed)
[place] INPATIENT:  Family/Significant Other Suicide Prevention Education  Suicide Prevention Education:  Patient Refusal for Family/Significant Other Suicide Prevention Education: The patient Timothy Stewart has refused to provide written consent for family/significant other to be provided Family/Significant Other Suicide Prevention Education during admission and/or prior to discharge.  Physician notified.  SPE completed with pt, as pt refused to consent to family contact. SPI pamphlet provided to pt and pt was encouraged to share information with support network, ask questions, and talk about any concerns relating to SPE. Pt denies access to guns/firearms and verbalized understanding of information provided. Mobile Crisis information also provided to pt.   Avelina Laine LCSW 08/07/2018, 11:34 AM

## 2018-08-07 NOTE — Progress Notes (Signed)
  [facility] Adult Case Management Discharge Plan :  Will you be returning to the same living situation after discharge:  Yes,  home At discharge, do you have transportation home?: Yes,  family member Do you have the ability to pay for your medications: Yes,  [employer]  Release of information consent forms completed and submitted to medical records by CSW.   Patient to Follow up at: Follow-up [place] Follow up on 08/10/2018.   Why:  Therapy appt scheduled with [place] for Thursday, 08/10/18 at 2:00PM. Medication management appt with Mateo Flow on Tuesday, 08/09/18 at 2:00PM. Thank you.  Contact information: [address] [place] [address] [phone]           Next level of care provider has access to [place] and Suicide Prevention discussed: Yes,  SPE completed with pt; pt declined to consent to collateral contact. SPI pamphlet and mobile crisis information provided.   Have you used any form of tobacco in the last 30 days? (Cigarettes, Smokeless Tobacco, Cigars, and/or Pipes): Yes  Has patient been referred to the Quitline?: Patient refused referral  Patient has been referred for addiction treatment: Yes  Avelina Laine, LCSW 08/07/2018, 11:34 AM

## 2018-08-07 NOTE — Discharge Summary (Signed)
Physician Discharge Summary Note  Patient:  Timothy Stewart is an 47 y.o., male MRN:  [id number] DOB:  [birth year] Patient phone:  [phone] (home)  Patient address:   [address] [place] [address],  Total Time spent with patient: 30 minutes  Date of Admission:  08/06/2018 Date of Discharge: 08/07/2018  Reason for Admission:  Worsening depression, SI, and alcohol use  Principal Problem: Severe recurrent major depression without psychotic features [facility]) Discharge Diagnoses: Patient Active Problem List   Diagnosis Date Noted  . Severe recurrent major depression without psychotic features ([place]) [F33.2] 08/06/2018  . Alcohol dependence ([place]) [F10.20] 08/06/2018    Past Psychiatric History: see H&P  Past Medical History:  Past Medical History:  Diagnosis Date  . Allergic reaction   . Hypertension     Past Surgical History:  Procedure Laterality Date  . ABDOMINAL SURGERY    . BACK SURGERY     Family History: History reviewed. No pertinent family history. Family Psychiatric  History: see H&P Social History:  Social History   Substance and Sexual Activity  Alcohol Use Yes   Comment: "I don't drink like that"     Social History   Substance and Sexual Activity  Drug Use Yes  . Types: Marijuana, Cocaine    Social History   Socioeconomic History  . Marital status: Married    Spouse name: Not on file  . Number of children: Not on file  . Years of education: Not on file  . Highest education level: Not on file  Occupational History  . Not on file  Social Needs  . Financial resource strain: Not on file  . Food insecurity:    Worry: Not on file    Inability: Not on file  . Transportation needs:    Medical: Not on file    Non-medical: Not on file  Tobacco Use  . Smoking status: Current Every Day Smoker    Packs/day: 1.00    Types: Cigarettes  . Smokeless tobacco: Never Used  Substance and Sexual Activity  . Alcohol use: Yes    Comment: "I  don't drink like that"  . Drug use: Yes    Types: Marijuana, Cocaine  . Sexual activity: Not on file  Lifestyle  . Physical activity:    Days per week: Not on file    Minutes per session: Not on file  . Stress: Not on file  Relationships  . Social connections:    Talks on phone: Not on file    Gets together: Not on file    Attends religious service: Not on file    Active member of club or organization: Not on file    Attends meetings of clubs or organizations: Not on file    Relationship status: Not on file  Other Topics Concern  . Not on file  Social History Narrative  . Not on file    Hospital Course:    History as per psychiatric intake suicide risk assessment: Reports he has been depressed, which he attributes to separation, and tension at work ( states that he had been there was a coworker who was targeting him related to race, he had submitted a complaint but " nothing happened". ) Reports he had been drinking heavily in June and July of this year as a way of coping with tension at work, and feels that this in turn resulted in increased marital tension, but says he had not been drinking regularly or often until day of  admission.  Presented to the hospital via [employer], who were reportedly called by family. Reports that on day of admission he felt acutely depressed on day of admission following one of  his daughters telling him he was no longer wanted at home. States he had not been drinking regularly or heavily, but did start drinking heavily after the above. ( BAL 294, UDS (+) for Cocaine and Cannabis.) Acknowledges he developed suicidal ideations, and made statement about hanging self . States that before this day he had been doing " all right" and was not severely depressed or having suicidal ideations. Denies anhedonia, denies changes in appetite, energy level, sleep. No prior psychiatric admissions , denies prior episodes of severe or significant depression, denies history of  suicide attempts, denies history of psychosis, denies history of violence .  As per evaluation today: Today upon evaluation, pt shares, "I'm doing much better." Pt shares about events prior to admission, "My daughter had said some things to me - that they'd be better off without me, and I started to think that she was right. 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 He denies SI/HI/AH/VH. He denies any specific physical complaints today. He is tolerating his medications well, and he is in agreement to continue his current regimen with only change being to increase dose of prozac from 10mg /day to 20mg /day. Pt would like to discharge as soon as possible. He is not interested in residential substance use treatment, but he is open to continue follow up with his outpatient therapist and to be referred to a psychiatrist for medication management. He was able to engage in safety planning including plan to return to [facility] or contact emergency services if he feels unable to maintain his own safety or the safety of others. Pt had no further questions, comments, or concerns.   The patient is at low risk of imminent suicide. Patient denied thoughts, intent, or plan for harm to self or others, expressed significant future orientation, and expressed an ability to mobilize assistance for his needs. He is presently void of any contributing psychiatric symptoms, cognitive difficulties, or substance use which would elevate his risk for lethality. Chronic risk for lethality is elevated in light of poor social support, poor adherence, and impulsivity. The chronic risk is presently mitigated by his ongoing desire and engagement in [facility] treatment and mobilization of support from family and friends. Chronic risk may elevate if he experiences any significant loss or  worsening of symptoms, which can be managed and monitored through outpatient providers. At this time, acute risk for lethality is low and he is stable for ongoing outpatient management.    Modifiable risk factors were addressed during this hospitalization through appropriate pharmacotherapy and establishment of outpatient follow-up treatment. Some risk factors for suicide are situational (i.e. Unstable social support) or related personality pathology (i.e. Poor coping mechanisms) and thus cannot be further mitigated by continued hospitalization in this setting.   Physical Findings: AIMS: Facial and Oral Movements Muscles of Facial Expression: None, normal Lips and Perioral Area: None, normal Jaw: None, normal Tongue: None, normal,Extremity Movements Upper (arms, wrists, hands, fingers): None, normal Lower (legs, knees, ankles, toes): None, normal, Trunk Movements Neck, shoulders, hips: None, normal, Overall Severity Severity of abnormal movements (highest score from questions above): None, normal Incapacitation due to abnormal movements: None, normal Patient's awareness of abnormal  movements (rate only patient's report): No Awareness, Dental Status Current problems with teeth and/or dentures?: No Does patient usually wear dentures?: No  CIWA:  CIWA-Ar Total: 0 COWS:  COWS Total Score: 0  Musculoskeletal: Strength & Muscle Tone: within normal limits Gait & Station: normal Patient leans: N/A  Psychiatric Specialty Exam: Physical Exam  Nursing note and vitals reviewed.   Review of Systems  Constitutional: Negative for chills and fever.  Respiratory: Negative for cough and shortness of breath.   Cardiovascular: Negative for chest pain.  Gastrointestinal: Negative for abdominal pain, heartburn, nausea and vomiting.  Psychiatric/Behavioral: Negative for depression, hallucinations and suicidal ideas. The patient is not nervous/anxious and does not have insomnia.     Blood pressure (!)  134/101, pulse 85, temperature 98.9 F (37.2 C), temperature source Oral, resp. rate 18, height 5\' 11"  (1.803 m), weight 76.2 kg.Body mass index is 23.43 kg/m.  General Appearance: Casual and Fairly Groomed  Eye Contact:  Good  Speech:  Clear and Coherent and Normal Rate  Volume:  Normal  Mood:  Euthymic  Affect:  Appropriate and Congruent  Thought Process:  Coherent and Goal Directed  Orientation:  Full (Time, Place, and Person)  Thought Content:  Logical  Suicidal Thoughts:  No  Homicidal Thoughts:  No  Memory:  Immediate;   Fair Recent;   Fair Remote;   Fair  Judgement:  Fair  Insight:  Fair  Psychomotor Activity:  Normal  Concentration:  Concentration: Fair  Recall:  [employer] of Knowledge:  Fair  Language:  Fair  Akathisia:  No  Handed:    AIMS (if indicated):     Assets:  Resilience Social Support  ADL's:  Intact  Cognition:  WNL  Sleep:  Number of Hours: 4.75     Have you used any form of tobacco in the last 30 days? (Cigarettes, Smokeless Tobacco, Cigars, and/or Pipes): Yes  Has this patient used any form of tobacco in the last 30 days? (Cigarettes, Smokeless Tobacco, Cigars, and/or Pipes) Yes, Yes, A prescription for an FDA-approved tobacco cessation medication was offered at discharge and the patient refused  Blood Alcohol level:  Lab Results  Component Value Date   ETH 294 (H) 23/53/6144    Metabolic Disorder Labs:  No results found for: HGBA1C, MPG No results found for: PROLACTIN No results found for: CHOL, TRIG, HDL, CHOLHDL, VLDL, LDLCALC  See Psychiatric Specialty Exam and Suicide Risk Assessment completed by Attending Physician prior to discharge.  Discharge destination:  Home  Is patient on multiple antipsychotic therapies at discharge:  No   Has Patient had three or more failed trials of antipsychotic monotherapy by history:  No  Recommended Plan for Multiple Antipsychotic Therapies: NA   Allergies as of 08/07/2018      Reactions    Codeine Rash      Medication List    STOP taking these medications   dicyclomine 20 MG tablet Commonly known as:  BENTYL   predniSONE 20 MG tablet Commonly known as:  DELTASONE     TAKE these medications     Indication  amLODipine 10 MG tablet Commonly known as:  NORVASC Take 10 mg by mouth daily.  Indication:  High Blood Pressure Disorder   EPINEPHrine 0.3 mg/0.3 mL Soaj injection Commonly known as:  EPI-PEN Inject 0.3 mLs (0.3 mg total) into the muscle once as needed (for severe allergic reaction). CAll 911 immediately if you have to use this medicine  Indication:  Life-Threatening Hypersensitivity Reaction  FLUoxetine 20 MG capsule Commonly known as:  PROZAC Take 1 capsule (20 mg total) by mouth daily. Start taking on:  08/08/2018  Indication:  Major Depressive Disorder   hydrOXYzine 50 MG tablet Commonly known as:  ATARAX/VISTARIL Take 1 tablet (50 mg total) by mouth every 6 (six) hours as needed for anxiety.  Indication:  Feeling Anxious   hyoscyamine 0.125 MG tablet Commonly known as:  LEVSIN, ANASPAZ Take 0.125 mg by mouth every 6 (six) hours as needed for spasms.  Indication:  Post-gastritis spasm   tadalafil 20 MG tablet Commonly known as:  ADCIRCA/CIALIS Take 20 mg by mouth as directed. One hour before intercouse  Indication:  Erectile Dysfunction       Follow-up recommendations:  Activity:  as tolerated Diet:  normal Tests:  NA Other:  see above for DC plan  Comments:    Signed: Pennelope Bracken, MD 08/07/2018, 11:13 AM

## 2018-08-07 NOTE — Progress Notes (Signed)
Recreation Therapy Notes  Date: 11.4.19 Time: 0930 Location: [address] Dayroom  Group Topic: Stress Management  Goal Area(s) Addresses:  Patient will verbalize importance of using healthy stress management.  Patient will identify positive emotions associated with healthy stress management.   Behavioral Response: Engaged  Intervention: Stress Management  Activity :  Meditation.  LRT introduced the stress management technique of meditation.  LRT played a meditation dealing with resilience.  Patients were to listen and follow along with the meditation.  Education:  Stress Management, Discharge Planning.   Education Outcome: Acknowledges edcuation/In group clarification offered/Needs additional education  Clinical Observations/Feedback: Pt attended and participated in group.     Victorino Sparrow, LRT/CTRS         Victorino Sparrow A 08/07/2018 10:55 AM

## 2018-08-07 NOTE — Progress Notes (Signed)
Discharge note: Patient reviewed discharge paperwork with RN including prescriptions, follow up appointments, and lab work. Patient given the opportunity to ask questions. All concerns were addressed. All belongings were returned to patient. Denied SI/HI/AVH. Patient thanked staff for their care while at the hospital.  Patient was discharged to lobby where a family friend was picking him up.

## 2019-06-07 ENCOUNTER — Emergency Department (HOSPITAL_COMMUNITY)
Admission: EM | Admit: 2019-06-07 | Discharge: 2019-06-07 | Disposition: A | Discharge status: Home / Self Care | Payer: 59 | Attending: Emergency Medicine | Admitting: Emergency Medicine

## 2019-06-07 ENCOUNTER — Encounter (HOSPITAL_COMMUNITY): Payer: Self-pay | Admitting: Emergency Medicine

## 2019-06-07 ENCOUNTER — Emergency Department (HOSPITAL_COMMUNITY): Payer: 59

## 2019-06-07 DIAGNOSIS — I10 Essential (primary) hypertension: Secondary | ICD-10-CM | POA: Insufficient documentation

## 2019-06-07 DIAGNOSIS — F17210 Nicotine dependence, cigarettes, uncomplicated: Secondary | ICD-10-CM | POA: Insufficient documentation

## 2019-06-07 DIAGNOSIS — Z79899 Other long term (current) drug therapy: Secondary | ICD-10-CM | POA: Diagnosis not present

## 2019-06-07 DIAGNOSIS — N492 Inflammatory disorders of scrotum: Secondary | ICD-10-CM | POA: Insufficient documentation

## 2019-06-07 DIAGNOSIS — F1210 Cannabis abuse, uncomplicated: Secondary | ICD-10-CM | POA: Diagnosis not present

## 2019-06-07 DIAGNOSIS — B356 Tinea cruris: Secondary | ICD-10-CM | POA: Insufficient documentation

## 2019-06-07 DIAGNOSIS — N50811 Right testicular pain: Secondary | ICD-10-CM

## 2019-06-07 DIAGNOSIS — F1410 Cocaine abuse, uncomplicated: Secondary | ICD-10-CM | POA: Insufficient documentation

## 2019-06-07 DIAGNOSIS — N50819 Testicular pain, unspecified: Secondary | ICD-10-CM | POA: Diagnosis present

## 2019-06-07 MED ORDER — ONDANSETRON HCL 4 MG/2ML IJ SOLN
4.0000 mg | Freq: Once | INTRAMUSCULAR | Status: DC
  Filled 2019-06-07: qty 2

## 2019-06-07 MED ORDER — KETOCONAZOLE 2 % EX CREA: 1.0000 "application " | TOPICAL_CREAM | Freq: Every day | CUTANEOUS | 0 refills | Status: DC

## 2019-06-07 MED ORDER — AMLODIPINE BESYLATE 5 MG PO TABS
10.0000 mg | ORAL_TABLET | Freq: Once | ORAL | Status: AC
  Administered 2019-06-07: 10 mg via ORAL
  Filled 2019-06-07: qty 2

## 2019-06-07 MED ORDER — OXYCODONE-ACETAMINOPHEN 5-325 MG PO TABS: 0.5000 | ORAL_TABLET | Freq: Four times a day (QID) | ORAL | 0 refills | Status: DC | PRN

## 2019-06-07 MED ORDER — CLINDAMYCIN HCL 150 MG PO CAPS
300.0000 mg | ORAL_CAPSULE | Freq: Once | ORAL | Status: AC
  Administered 2019-06-07: 16:00:00 300 mg via ORAL
  Filled 2019-06-07: qty 2

## 2019-06-07 MED ORDER — CLINDAMYCIN HCL 150 MG PO CAPS: 300.0000 mg | ORAL_CAPSULE | Freq: Three times a day (TID) | ORAL | 0 refills | Status: DC

## 2019-06-07 MED ORDER — AMLODIPINE BESYLATE 10 MG PO TABS: 10.0000 mg | ORAL_TABLET | Freq: Every day | ORAL | 0 refills | Status: AC

## 2019-06-07 MED ORDER — FLUCONAZOLE 150 MG PO TABS
150.0000 mg | ORAL_TABLET | Freq: Once | ORAL | Status: AC
  Administered 2019-06-07: 16:00:00 150 mg via ORAL
  Filled 2019-06-07: qty 1

## 2019-06-07 MED ORDER — LIDOCAINE HCL (PF) 1 % IJ SOLN
INTRAMUSCULAR | Status: AC
  Administered 2019-06-07: 16:00:00
  Filled 2019-06-07: qty 5

## 2019-06-07 MED ORDER — CEFTRIAXONE SODIUM 250 MG IJ SOLR
250.0000 mg | Freq: Once | INTRAMUSCULAR | Status: AC
  Administered 2019-06-07: 16:00:00 250 mg via INTRAMUSCULAR
  Filled 2019-06-07: qty 250

## 2019-06-07 MED ORDER — OXYCODONE-ACETAMINOPHEN 5-325 MG PO TABS
2.0000 | ORAL_TABLET | Freq: Once | ORAL | Status: AC
  Administered 2019-06-07: 2 via ORAL
  Filled 2019-06-07: qty 2

## 2019-06-07 MED ORDER — ONDANSETRON 4 MG PO TBDP
4.0000 mg | ORAL_TABLET | Freq: Once | ORAL | Status: AC
  Administered 2019-06-07: 4 mg via ORAL
  Filled 2019-06-07: qty 1

## 2019-06-07 NOTE — Discharge Instructions (Signed)

## 2019-06-07 NOTE — ED Triage Notes (Signed)
Pt found a knot on right testicle Tuesday, yesterday got bigger, today testicle is hard and painful--  No urinary problems- states that he has poison ivy and thinks it may have spread to testicle-- also c/o swollen glands in right groin.

## 2019-06-07 NOTE — ED Notes (Signed)
Patient verbalizes understanding of discharge instructions. Opportunity for questioning and answers were provided. Armband removed by staff, pt discharged from ED.  

## 2019-06-07 NOTE — ED Provider Notes (Signed)
[place] EMERGENCY DEPARTMENT Provider Note   CSN: [id number] Arrival date & time: 06/07/19  1156     History   Chief Complaint Chief Complaint  Patient presents with  . Testicle Pain    HPI Timothy Stewart is a 48 y.o. male who presents the emergency department with chief complaint of right-sided testicle pain.  Patient states that his pain began Tuesday and has progressively worsened over the past 3 days.  He complains of pain in the groin and along the right side of his testicle.  He complains of pain and swelling.  Patient also has poison ivy and thinks that he may have had it in his groin has been scratching that area.  He denies fevers or chills.  He denies penile discharge, difficulty urinating, flank or abdominal pain.  He is never had anything like this before.     HPI  Past Medical History:  Diagnosis Date  . Allergic reaction   . Hypertension     Patient Active Problem List   Diagnosis Date Noted  . Severe recurrent major depression without psychotic features ([place]) 08/06/2018  . Alcohol dependence ([place]) 08/06/2018    Past Surgical History:  Procedure Laterality Date  . ABDOMINAL SURGERY    . BACK SURGERY          Home Medications    Prior to Admission medications   Medication Sig Start Date End Date Taking? Authorizing Provider  amLODipine (NORVASC) 10 MG tablet Take 10 mg by mouth daily. 08/03/18  Yes [provider]  tadalafil (ADCIRCA/CIALIS) 20 MG tablet Take 20 mg by mouth as directed. One hour before intercouse 08/03/18  Yes [provider]  EPINEPHrine (EPIPEN 2-PAK) 0.3 mg/0.3 mL SOAJ injection Inject 0.3 mLs (0.3 mg total) into the muscle once as needed (for severe allergic reaction). CAll 911 immediately if you have to use this medicine Patient not taking: Reported on 06/07/2019 11/26/13   Montine Circle, PA-C  FLUoxetine (PROZAC) 20 MG capsule Take 1 capsule (20 mg total) by mouth daily. Patient not taking:  Reported on 06/07/2019 08/08/18   Pennelope Bracken, MD  hydrOXYzine (ATARAX/VISTARIL) 50 MG tablet Take 1 tablet (50 mg total) by mouth every 6 (six) hours as needed for anxiety. Patient not taking: Reported on 06/07/2019 08/07/18   Pennelope Bracken, MD    Family History History reviewed. No pertinent family history.  Social History Social History   Tobacco Use  . Smoking status: Current Every Day Smoker    Packs/day: 1.00    Types: Cigarettes  . Smokeless tobacco: Never Used  Substance Use Topics  . Alcohol use: Yes    Comment: "I don't drink like that"  . Drug use: Yes    Types: Marijuana, Cocaine     Allergies   Codeine   Review of Systems Review of Systems Ten systems reviewed and are negative for acute change, except as noted in the HPI.    Physical Exam Updated Vital Signs BP (!) 171/100 (BP Location: Right Arm)   Pulse 94   Temp 99.4 F (37.4 C) (Oral)   Resp 20   SpO2 98%   Physical Exam Vitals signs and nursing note reviewed. Exam conducted with a chaperone present.  Constitutional:      General: He is not in acute distress.    Appearance: He is well-developed. He is not diaphoretic.  HENT:     Head: Normocephalic and atraumatic.  Eyes:     General: No scleral  icterus.    Conjunctiva/sclera: Conjunctivae normal.  Neck:     Musculoskeletal: Normal range of motion and neck supple.  Cardiovascular:     Rate and Rhythm: Normal rate and regular rhythm.     Heart sounds: Normal heart sounds.  Pulmonary:     Effort: Pulmonary effort is normal. No respiratory distress.     Breath sounds: Normal breath sounds.  Abdominal:     Palpations: Abdomen is soft.     Tenderness: There is no abdominal tenderness.  Genitourinary:    Pubic Area: Rash present.       Comments: Bilateral testicles are non-tender, non-swollen.  No palpable hernia.  Cremasteric reflex intact.  The right scrotum is indurated, exquisitely tender proximal to the inguinal  region, there is thickening of the spermatic cord.  No palpable hernia, no bowel sounds present.  Right inguinal lymphadenopathy and tenderness present. Penis is normal and circumcised.  No penile discharge, no other rashes noted aside from documented inguinal rash. Skin:    General: Skin is warm and dry.  Neurological:     Mental Status: He is alert.  Psychiatric:        Behavior: Behavior normal.      ED Treatments / Results  Labs (all labs ordered are listed, but only abnormal results are displayed) Labs Reviewed - No data to display  EKG None  Radiology No results found.  Procedures Procedures (including critical care time)  Medications Ordered in ED Medications  oxyCODONE-acetaminophen (PERCOCET/ROXICET) 5-325 MG per tablet 2 tablet (has no administration in time range)  ondansetron (ZOFRAN) injection 4 mg (has no administration in time range)     Initial Impression / Assessment and Plan / ED Course  I have reviewed the triage vital signs and the nursing notes.  Pertinent labs & imaging results that were available during my care of the patient were reviewed by me and considered in my medical decision making (see chart for details).  Clinical Course as of Jun 07 1313  Thu Jun 07, 2019  1516 US SCROTUM W/DOPPLER [AH]    Clinical Course User Index [AH] Margarita Mail, PA-C      Patient here in the emergency department with chief complaint of testicle pain.  Clinical examination shows indurated scrotum with tenderness and right inguinal lymphadenopathy.  Scrotal ultrasound shows no testicular or epididymal swelling.  Good flow to both testicles.  There is cellulitic hyperemic tissue in the right upper scrotal region.  I was unable to view the images personally given our new PACS system however I did discuss the findings with the radiologist.  There is no collection of drainable fluid.  I feel that this is a secondary cellulitis due to his underlying tinea cruris.   Patient treated here with ceftriaxone, fluconazole and oral Clindamycin.  He will continue with oral clindamycin and ketoconazole cream.  I personally reviewed the PDMP and prescribed pain medication..  No evidence of Fournier's gangrene.  Follow-up with urology.  Discussed return precautions.  Appears appropriate for discharge at this time  Final Clinical Impressions(s) / ED Diagnoses   Final diagnoses:  None    ED Discharge Orders    None       Margarita Mail, PA-C 06/08/19 1318    [name], MD 06/08/19 [phone]

## 2019-06-24 ENCOUNTER — Emergency Department (HOSPITAL_COMMUNITY)
Admission: EM | Admit: 2019-06-24 | Discharge: 2019-06-24 | Disposition: A | Discharge status: Home / Self Care | Payer: 59 | Attending: Emergency Medicine | Admitting: Emergency Medicine

## 2019-06-24 ENCOUNTER — Other Ambulatory Visit: Payer: Self-pay

## 2019-06-24 ENCOUNTER — Encounter (HOSPITAL_COMMUNITY): Payer: Self-pay

## 2019-06-24 DIAGNOSIS — I10 Essential (primary) hypertension: Secondary | ICD-10-CM | POA: Insufficient documentation

## 2019-06-24 DIAGNOSIS — R591 Generalized enlarged lymph nodes: Secondary | ICD-10-CM | POA: Diagnosis not present

## 2019-06-24 DIAGNOSIS — R21 Rash and other nonspecific skin eruption: Secondary | ICD-10-CM | POA: Diagnosis present

## 2019-06-24 DIAGNOSIS — Z114 Encounter for screening for human immunodeficiency virus [HIV]: Secondary | ICD-10-CM | POA: Insufficient documentation

## 2019-06-24 DIAGNOSIS — F17210 Nicotine dependence, cigarettes, uncomplicated: Secondary | ICD-10-CM | POA: Insufficient documentation

## 2019-06-24 LAB — COMPREHENSIVE METABOLIC PANEL
ALT: 21 U/L (ref 0–44)
AST: 32 U/L (ref 15–41)
Albumin: 3.5 g/dL (ref 3.5–5.0)
Alkaline Phosphatase: 49 U/L (ref 38–126)
Anion gap: 10 (ref 5–15)
BUN: <5 mg/dL — ABNORMAL LOW (ref 6–20)
CO2: 29 mmol/L (ref 22–32)
Calcium: 9 mg/dL (ref 8.9–10.3)
Chloride: 99 mmol/L (ref 98–111)
Creatinine, Ser: 0.95 mg/dL (ref 0.61–1.24)
GFR calc Af Amer: >60 mL/min (ref >60)
GFR calc non Af Amer: >60 mL/min (ref >60)
Glucose, Bld: 137 mg/dL — ABNORMAL HIGH (ref 70–99)
Potassium: 3.2 mmol/L — ABNORMAL LOW (ref 3.5–5.1)
Sodium: 138 mmol/L (ref 135–145)
Total Bilirubin: 0.4 mg/dL (ref 0.3–1.2)
Total Protein: 6.8 g/dL (ref 6.5–8.1)

## 2019-06-24 LAB — CBC
HCT: 43.8 % (ref 39.0–52.0)
Hemoglobin: 14.8 g/dL (ref 13.0–17.0)
MCH: 29.3 pg (ref 26.0–34.0)
MCHC: 33.8 g/dL (ref 30.0–36.0)
MCV: 86.7 fL (ref 80.0–100.0)
Platelets: 310 10*3/uL (ref 150–400)
RBC: 5.05 MIL/uL (ref 4.22–5.81)
RDW: 14.1 % (ref 11.5–15.5)
WBC: 12.7 10*3/uL — ABNORMAL HIGH (ref 4.0–10.5)
nRBC: 0 % (ref 0.0–0.2)

## 2019-06-24 LAB — RAPID HIV SCREEN (HIV 1/2 AB+AG)
HIV 1/2 Antibodies: NONREACTIVE
HIV-1 P24 Antigen - HIV24: NONREACTIVE

## 2019-06-24 LAB — LIPASE, BLOOD: Lipase: 36 U/L (ref 11–51)

## 2019-06-24 MED ORDER — KETOCONAZOLE 2 % EX CREA: 1.0000 "application " | TOPICAL_CREAM | Freq: Every day | CUTANEOUS | 0 refills | Status: DC

## 2019-06-24 MED ORDER — DEXAMETHASONE SODIUM PHOSPHATE 10 MG/ML IJ SOLN
10.0000 mg | Freq: Once | INTRAMUSCULAR | Status: AC
  Administered 2019-06-24: 10 mg via INTRAMUSCULAR
  Filled 2019-06-24: qty 1

## 2019-06-24 MED ORDER — SODIUM CHLORIDE 0.9% FLUSH: 3.0000 mL | Freq: Once | INTRAVENOUS | Status: DC

## 2019-06-24 MED ORDER — SULFAMETHOXAZOLE-TRIMETHOPRIM 800-160 MG PO TABS: 1.0000 | ORAL_TABLET | Freq: Two times a day (BID) | ORAL | 0 refills | Status: AC

## 2019-06-24 NOTE — Discharge Instructions (Addendum)
You were evaluated in the Emergency Department and after careful evaluation, we did not find any emergent condition requiring admission or further testing in the hospital.  Your exam/testing today was overall reassuring.  Please take the medications provided as directed and follow-up with your primary care doctor for further testing if your symptoms persist.  Please return to the Emergency Department if you experience any worsening of your condition.  We encourage you to follow up with a primary care provider.  Thank you for allowing [place] to be a part of your care.

## 2019-06-24 NOTE — ED Provider Notes (Signed)
[facility] Emergency Department Provider Note MRN:  [id number]  Arrival date & time: 06/24/19     Chief Complaint   Rash and Abscess   History of Present Illness   Timothy Stewart is a 48 y.o. year-old male with a history of hypertension presenting to the ED with chief complaint of rash.  3 weeks of rash.  Began on the left hand with redness, and eventually the red skin flaked off.  Similar occurrences to his groin bilaterally after that, and then his bilateral axilla.  Having some sores and boils to bilateral groin and axilla as well.  Denies fever, no new medications, no nausea vomiting, no chest pain or shortness of breath, no abdominal pain, no dysuria, no numbness or weakness to the arms or legs, no recent travel, only 1 sexual partner in the last month.  Denies intercourse with men.  Review of Systems  A complete 10 system review of systems was obtained and all systems are negative except as noted in the HPI and PMH.   Patient's Health History    Past Medical History:  Diagnosis Date  . Allergic reaction   . Hypertension     Past Surgical History:  Procedure Laterality Date  . ABDOMINAL SURGERY    . BACK SURGERY      No family history on file.  Social History   Socioeconomic History  . Marital status: Married    Spouse name: Not on file  . Number of children: Not on file  . Years of education: Not on file  . Highest education level: Not on file  Occupational History  . Not on file  Social Needs  . Financial resource strain: Not on file  . Food insecurity    Worry: Not on file    Inability: Not on file  . Transportation needs    Medical: Not on file    Non-medical: Not on file  Tobacco Use  . Smoking status: Current Every Day Smoker    Packs/day: 1.00    Types: Cigarettes  . Smokeless tobacco: Never Used  Substance and Sexual Activity  . Alcohol use: Yes    Comment: "I don't drink like that"  . Drug use: Yes    Types: Marijuana,  Cocaine  . Sexual activity: Not on file  Lifestyle  . Physical activity    Days per week: Not on file    Minutes per session: Not on file  . Stress: Not on file  Relationships  . Social Herbalist on phone: Not on file    Gets together: Not on file    Attends religious service: Not on file    Active member of club or organization: Not on file    Attends meetings of clubs or organizations: Not on file    Relationship status: Not on file  . Intimate partner violence    Fear of current or ex partner: Not on file    Emotionally abused: Not on file    Physically abused: Not on file    Forced sexual activity: Not on file  Other Topics Concern  . Not on file  Social History Narrative  . Not on file     Physical Exam  Vital Signs and Nursing Notes reviewed Vitals:   06/24/19 0304 06/24/19 0520  BP: (!) 156/124 (!) 157/110  Pulse: 87 78  Resp: 20 20  Temp: 98.7 F (37.1 C)   SpO2: 100% 100%    CONSTITUTIONAL:  well-appearing, NAD NEURO:  Alert and oriented x 3, no focal deficits EYES:  eyes equal and reactive ENT/NECK:  no LAD, no JVD CARDIO:  regular rate, well-perfused, normal S1 and S2 PULM:  CTAB no wheezing or rhonchi GI/GU:  normal bowel sounds, non-distended, non-tender MSK/SPINE:  No gross deformities, no edema SKIN: Evidence of desquamated skin to bilateral groin with mild erythema, scattered small wounds; bilateral axillary lymphadenopathy PSYCH:  Appropriate speech and behavior  Diagnostic and Interventional Summary    Labs Reviewed  COMPREHENSIVE METABOLIC PANEL - Abnormal; Notable for the following components:      Result Value   Potassium 3.2 (*)    Glucose, Bld 137 (*)    BUN <5 (*)    All other components within normal limits  CBC - Abnormal; Notable for the following components:   WBC 12.7 (*)    All other components within normal limits  LIPASE, BLOOD  URINALYSIS, ROUTINE W REFLEX MICROSCOPIC  RAPID HIV SCREEN (HIV 1/2 AB+AG)    DG  Chest 2 View    (Results Pending)    Medications  sodium chloride flush (NS) 0.9 % injection 3 mL (has no administration in time range)     Procedures Critical Care  ED Course and Medical Decision Making  I have reviewed the triage vital signs and the nursing notes.  Pertinent labs & imaging results that were available during my care of the patient were reviewed by me and considered in my medical decision making (see below for details).  Unclear etiology of patient's rash, but no signs of emergent condition present.  Normal vital signs, no fever, well-appearing, lymphadenopathy and desquamation of the epithelium in certain regions does raise concern for HIV, will test here.  Patient was improving on the ketoconazole cream, will refill.  Patient's rash seems to spread to the axilla after taking with the clindamycin, will switch to Bactrim.  Had considered incision and drainage to the nodules of the left axilla but on ultrasound there are signs that these nodules are lymph nodes.  Advised PCP follow-up, strict return precautions.  [name]. [name], [name]  Final Clinical Impressions(s) / ED Diagnoses     ICD-10-CM   1. Lymphadenopathy  R59.1 DG Chest 2 View    DG Chest 2 View    ED Discharge Orders    None      Discharge Instructions Discussed with and Provided to Patient: Discharge Instructions   None       [name], MD 06/24/19 [id number]

## 2019-06-24 NOTE — ED Triage Notes (Signed)
Pt states that he was seen last week for a rash, put on antibiotics and finished. Pt now has skin peeling to the L hand and multiple abscesses to bilateral armpits and groin area, no drainage from any of the abscesses. Denies fevers, reports diarrhea as well for the past two weeks

## 2020-10-15 DIAGNOSIS — I10 Essential (primary) hypertension: Secondary | ICD-10-CM | POA: Diagnosis not present

## 2020-10-15 DIAGNOSIS — M7660 Achilles tendinitis, unspecified leg: Secondary | ICD-10-CM | POA: Diagnosis not present

## 2020-10-15 DIAGNOSIS — Z0000 Encounter for general adult medical examination without abnormal findings: Secondary | ICD-10-CM | POA: Diagnosis not present

## 2020-10-15 DIAGNOSIS — Z125 Encounter for screening for malignant neoplasm of prostate: Secondary | ICD-10-CM | POA: Diagnosis not present

## 2020-10-17 DIAGNOSIS — R7309 Other abnormal glucose: Secondary | ICD-10-CM | POA: Diagnosis not present

## 2020-10-17 DIAGNOSIS — Z0000 Encounter for general adult medical examination without abnormal findings: Secondary | ICD-10-CM | POA: Diagnosis not present

## 2020-10-17 DIAGNOSIS — I10 Essential (primary) hypertension: Secondary | ICD-10-CM | POA: Diagnosis not present

## 2020-10-17 DIAGNOSIS — Z125 Encounter for screening for malignant neoplasm of prostate: Secondary | ICD-10-CM | POA: Diagnosis not present

## 2020-10-31 ENCOUNTER — Ambulatory Visit (INDEPENDENT_AMBULATORY_CARE_PROVIDER_SITE_OTHER): Payer: BC Managed Care – PPO | Admitting: Podiatry

## 2020-10-31 ENCOUNTER — Encounter: Payer: Self-pay | Admitting: Podiatry

## 2020-10-31 ENCOUNTER — Other Ambulatory Visit: Payer: Self-pay

## 2020-10-31 ENCOUNTER — Ambulatory Visit (INDEPENDENT_AMBULATORY_CARE_PROVIDER_SITE_OTHER): Payer: BC Managed Care – PPO

## 2020-10-31 DIAGNOSIS — M79671 Pain in right foot: Secondary | ICD-10-CM | POA: Diagnosis not present

## 2020-10-31 DIAGNOSIS — M79672 Pain in left foot: Secondary | ICD-10-CM

## 2020-10-31 DIAGNOSIS — M7662 Achilles tendinitis, left leg: Secondary | ICD-10-CM

## 2020-10-31 MED ORDER — NITROGLYCERIN 0.1 MG/HR TD PT24: 0.2000 mg | MEDICATED_PATCH | Freq: Every day | TRANSDERMAL | Status: AC

## 2020-10-31 MED ORDER — DICLOFENAC SODIUM 75 MG PO TBEC: 75.0000 mg | DELAYED_RELEASE_TABLET | Freq: Two times a day (BID) | ORAL | 2 refills | Status: AC

## 2020-10-31 NOTE — Patient Instructions (Signed)

## 2020-11-02 NOTE — Progress Notes (Signed)
Subjective:   Patient ID: Timothy Stewart, male   DOB: 50 y.o.   MRN: [id number]   HPI Patient presents stating that he has had a lot of pain in the back of his left heel for the last several months and that he does not remember specific injury but he feels like there is a knot.  Patient has good digital perfusion well oriented x3   Review of Systems  All other systems reviewed and are negative.       Objective:  Physical Exam Vitals and nursing note reviewed.  Constitutional:      Appearance: He is well-developed and well-nourished.  Cardiovascular:     Pulses: Intact distal pulses.  Pulmonary:     Effort: Pulmonary effort is normal.  Musculoskeletal:        General: Normal range of motion.  Skin:    General: Skin is warm.  Neurological:     Mental Status: He is alert.     Neurovascular status found to be intact muscle strength found to be adequate range of motion adequate.  Patient is noted to have discomfort at the Achilles tendon at the muscle tendon junction not at the insertion with some nodular formation around this area.  It is very tender when pressed from the sides and I did check and found muscle strength to be adequate no indications of abnormal function     Assessment:  Achilles tendinitis left that the muscle tendon junction that may involve some moderate tear of fibers or nodular formation with no indication so far of loss of strength     Plan:  H&P x-ray reviewed today.  I went ahead today and due to the intense discomfort I did apply an air fracture walker with all instructions on usage instructed on stretching exercises and nitro patches which were dispensed.  Patient will be checked back in the next 4 weeks and may require MRI  X-rays indicate that there is no signs of bone spur formation with this with some indication of

## 2020-11-10 DIAGNOSIS — N529 Male erectile dysfunction, unspecified: Secondary | ICD-10-CM | POA: Diagnosis not present

## 2020-11-10 DIAGNOSIS — I10 Essential (primary) hypertension: Secondary | ICD-10-CM | POA: Diagnosis not present

## 2020-11-10 DIAGNOSIS — Z0000 Encounter for general adult medical examination without abnormal findings: Secondary | ICD-10-CM | POA: Diagnosis not present

## 2020-11-10 DIAGNOSIS — Z1211 Encounter for screening for malignant neoplasm of colon: Secondary | ICD-10-CM | POA: Diagnosis not present

## 2020-11-28 ENCOUNTER — Other Ambulatory Visit: Payer: Self-pay

## 2020-11-28 ENCOUNTER — Encounter: Payer: Self-pay | Admitting: Podiatry

## 2020-11-28 ENCOUNTER — Ambulatory Visit (INDEPENDENT_AMBULATORY_CARE_PROVIDER_SITE_OTHER): Payer: BC Managed Care – PPO | Admitting: Podiatry

## 2020-11-28 DIAGNOSIS — M7662 Achilles tendinitis, left leg: Secondary | ICD-10-CM

## 2020-11-29 NOTE — Progress Notes (Signed)
Subjective:   Patient ID: Timothy Stewart, male   DOB: 50 y.o.   MRN: [id number]   HPI Patient presents stating has had improvement and he is just starting to reduce the usage of his boot   ROS      Objective:  Physical Exam  Neurovascular status intact with patient found to have mild to moderate discomfort in the posterior left heel at the muscle tendon junction with no indications of issues as far as strength or function with no equinus noted     Assessment:  Muscle tendon Achilles tendinitis left improved with nodular formation still present     Plan:  H&P reviewed condition and I have recommended continued physical therapy anti-inflammatories boot usage with gradual reduction over the next 4 to 6 weeks and heel lift. Patient will be seen back if symptoms persist or come back

## 2021-01-15 DIAGNOSIS — Z20822 Contact with and (suspected) exposure to covid-19: Secondary | ICD-10-CM | POA: Diagnosis not present

## 2021-08-18 DIAGNOSIS — I10 Essential (primary) hypertension: Secondary | ICD-10-CM | POA: Diagnosis not present

## 2021-08-18 DIAGNOSIS — L0292 Furuncle, unspecified: Secondary | ICD-10-CM | POA: Diagnosis not present

## 2021-09-16 DIAGNOSIS — Z0000 Encounter for general adult medical examination without abnormal findings: Secondary | ICD-10-CM | POA: Diagnosis not present

## 2021-09-16 DIAGNOSIS — I10 Essential (primary) hypertension: Secondary | ICD-10-CM | POA: Diagnosis not present

## 2021-09-16 DIAGNOSIS — R946 Abnormal results of thyroid function studies: Secondary | ICD-10-CM | POA: Diagnosis not present

## 2021-09-16 DIAGNOSIS — R7309 Other abnormal glucose: Secondary | ICD-10-CM | POA: Diagnosis not present

## 2021-09-16 DIAGNOSIS — N529 Male erectile dysfunction, unspecified: Secondary | ICD-10-CM | POA: Diagnosis not present

## 2021-09-16 DIAGNOSIS — L0292 Furuncle, unspecified: Secondary | ICD-10-CM | POA: Diagnosis not present

## 2021-09-16 DIAGNOSIS — Z125 Encounter for screening for malignant neoplasm of prostate: Secondary | ICD-10-CM | POA: Diagnosis not present

## 2021-09-21 ENCOUNTER — Other Ambulatory Visit (HOSPITAL_COMMUNITY): Payer: Self-pay

## 2021-09-21 MED ORDER — CHLORTHALIDONE 25 MG PO TABS
25.0000 mg | ORAL_TABLET | Freq: Every day | ORAL | 1 refills | Status: DC
  Filled 2021-09-21: qty 90, 90d supply, fill #0

## 2021-09-21 MED ORDER — CHLORTHALIDONE 25 MG PO TABS
25.0000 mg | ORAL_TABLET | Freq: Every morning | ORAL | 0 refills | Status: DC
  Filled 2021-09-21 – 2021-12-30 (×4): qty 30, 30d supply, fill #0

## 2021-09-21 MED ORDER — TADALAFIL 20 MG PO TABS
20.0000 mg | ORAL_TABLET | Freq: Every day | ORAL | 1 refills | Status: DC | PRN
  Filled 2021-09-21: qty 90, 90d supply, fill #0

## 2021-09-21 MED ORDER — TADALAFIL 20 MG PO TABS
20.0000 mg | ORAL_TABLET | Freq: Every day | ORAL | 1 refills | Status: DC | PRN
  Filled 2021-09-21 – 2021-12-30 (×3): qty 90, 90d supply, fill #0
  Filled 2022-04-30: qty 24, 24d supply, fill #1
  Filled 2022-04-30: qty 60, 60d supply, fill #1

## 2021-12-17 DIAGNOSIS — R7309 Other abnormal glucose: Secondary | ICD-10-CM | POA: Diagnosis not present

## 2021-12-17 DIAGNOSIS — Z1211 Encounter for screening for malignant neoplasm of colon: Secondary | ICD-10-CM | POA: Diagnosis not present

## 2021-12-17 DIAGNOSIS — Z125 Encounter for screening for malignant neoplasm of prostate: Secondary | ICD-10-CM | POA: Diagnosis not present

## 2021-12-17 DIAGNOSIS — Z23 Encounter for immunization: Secondary | ICD-10-CM | POA: Diagnosis not present

## 2021-12-17 DIAGNOSIS — Z0000 Encounter for general adult medical examination without abnormal findings: Secondary | ICD-10-CM | POA: Diagnosis not present

## 2021-12-17 DIAGNOSIS — R946 Abnormal results of thyroid function studies: Secondary | ICD-10-CM | POA: Diagnosis not present

## 2021-12-17 DIAGNOSIS — N529 Male erectile dysfunction, unspecified: Secondary | ICD-10-CM | POA: Diagnosis not present

## 2021-12-17 DIAGNOSIS — I10 Essential (primary) hypertension: Secondary | ICD-10-CM | POA: Diagnosis not present

## 2021-12-30 ENCOUNTER — Other Ambulatory Visit (HOSPITAL_COMMUNITY): Payer: Self-pay

## 2021-12-30 MED ORDER — CHLORTHALIDONE 25 MG PO TABS
25.0000 mg | ORAL_TABLET | Freq: Every morning | ORAL | 3 refills | Status: DC
  Filled 2022-07-15: qty 90, 90d supply, fill #0

## 2021-12-30 MED ORDER — TADALAFIL 20 MG PO TABS: 20.0000 mg | ORAL_TABLET | Freq: Every day | ORAL | 3 refills | Status: DC | PRN

## 2021-12-31 ENCOUNTER — Other Ambulatory Visit (HOSPITAL_COMMUNITY): Payer: Self-pay

## 2022-01-01 ENCOUNTER — Other Ambulatory Visit (HOSPITAL_COMMUNITY): Payer: Self-pay

## 2022-01-05 ENCOUNTER — Other Ambulatory Visit (HOSPITAL_COMMUNITY): Payer: Self-pay

## 2022-01-21 ENCOUNTER — Ambulatory Visit: Payer: 59 | Admitting: Podiatry

## 2022-01-21 ENCOUNTER — Encounter: Payer: Self-pay | Admitting: Podiatry

## 2022-01-21 ENCOUNTER — Other Ambulatory Visit (HOSPITAL_COMMUNITY): Payer: Self-pay

## 2022-01-21 ENCOUNTER — Ambulatory Visit (INDEPENDENT_AMBULATORY_CARE_PROVIDER_SITE_OTHER): Payer: 59

## 2022-01-21 DIAGNOSIS — L84 Corns and callosities: Secondary | ICD-10-CM

## 2022-01-21 DIAGNOSIS — M7662 Achilles tendinitis, left leg: Secondary | ICD-10-CM

## 2022-01-21 DIAGNOSIS — M7661 Achilles tendinitis, right leg: Secondary | ICD-10-CM | POA: Diagnosis not present

## 2022-01-21 DIAGNOSIS — B49 Unspecified mycosis: Secondary | ICD-10-CM

## 2022-01-21 DIAGNOSIS — M7752 Other enthesopathy of left foot: Secondary | ICD-10-CM | POA: Diagnosis not present

## 2022-01-21 MED ORDER — TERBINAFINE HCL 250 MG PO TABS
250.0000 mg | ORAL_TABLET | Freq: Every day | ORAL | 0 refills | Status: AC
  Filled 2022-01-21 – 2022-02-03 (×2): qty 45, 45d supply, fill #0

## 2022-01-21 MED ORDER — TRIAMCINOLONE ACETONIDE 10 MG/ML IJ SUSP
10.0000 mg | Freq: Once | INTRAMUSCULAR | Status: AC
  Administered 2022-01-21: 10 mg

## 2022-01-21 NOTE — Progress Notes (Signed)
Subjective:  ? ?Patient ID: Timothy Stewart, male   DOB: 51 y.o.   MRN: [id number]  ? ?HPI ?Patient presents stating still having some Achilles tendon pain left but is developed tremendous discomfort between the fourth and fifth toes left.  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 ? ?ROS ? ? ?   ?Objective:  ?Physical Exam  ?Neurovascular status intact with moderate discomfort at the muscle tendon junction left Achilles with patient not wearing his heel lift currently with patient also noted to have inflammatory changes fourth interspace left with keratotic tissue formation within the interspace and slight whiteness also between the third and fourth second and third toes.  Patient is found to have good digital perfusion well oriented x3 ? ?   ?Assessment:  ?Digital deformity with pressure occurring between the fourth and fifth toes with possibility for interspace lesion and capsulitis around the fourth MPJ along with Achilles tendinitis left ? ?   ?Plan:  ?A H&P x-rays reviewed discussed Achilles tendinitis do not recommend current treatment but I do think ultimately may require other treatments.  He will use his boot I dispensed a heel lift and he will stretch and for the fourth interspace I did do a periarticular injection around the fourth MPJ 3 mg Dexasone Kenalog 5 mg Xylocaine I debrided tissue I discussed possibility for arthroplasty partial syndactylization if symptoms return and I placed him on a 45-day regimen of oral antifungal therapy.  Reappoint in 8 weeks to reevaluate ? ?X-rays indicate that there is abnormal position of the fifth toe left creating pressure between the fourth and fifth toes ?   ? ? ?

## 2022-02-01 ENCOUNTER — Other Ambulatory Visit (HOSPITAL_COMMUNITY): Payer: Self-pay

## 2022-02-03 ENCOUNTER — Other Ambulatory Visit (HOSPITAL_COMMUNITY): Payer: Self-pay

## 2022-02-11 ENCOUNTER — Other Ambulatory Visit (HOSPITAL_COMMUNITY): Payer: Self-pay

## 2022-03-19 ENCOUNTER — Encounter: Payer: Self-pay | Admitting: Podiatry

## 2022-03-19 ENCOUNTER — Telehealth: Payer: Self-pay | Admitting: Urology

## 2022-03-19 ENCOUNTER — Ambulatory Visit: Payer: 59 | Admitting: Podiatry

## 2022-03-19 DIAGNOSIS — I10 Essential (primary) hypertension: Secondary | ICD-10-CM | POA: Insufficient documentation

## 2022-03-19 DIAGNOSIS — M7750 Other enthesopathy of unspecified foot: Secondary | ICD-10-CM | POA: Diagnosis not present

## 2022-03-19 DIAGNOSIS — N529 Male erectile dysfunction, unspecified: Secondary | ICD-10-CM | POA: Insufficient documentation

## 2022-03-19 NOTE — Telephone Encounter (Signed)
DOS - 03/23/22  HAMMERTOE REPAIR 5TH LEFT --- 64383 WEBBING PROCEDURE 4/5 LEFT --- 77939  [facility] EFFECTIVE DATE - 10/04/16   PLAN DEDUCTIBLE - $350.00 W/ $0.00 REMAINING OUT OF POCKET - $7,900.00 W/ $7,530.00 REMAINING COINSURANCE - 20% COPAY - $0.00   SPOKE WITH NICOLETE WITH [facility] AND SHE STATED THAT FOR CPT CODES 68864 AND 84720 NO PRIOR AUTH IS REQUIRED.  REF # [id number]

## 2022-03-19 NOTE — Progress Notes (Signed)
Subjective:   Patient ID: Timothy Stewart, male   DOB: 51 y.o.   MRN: [id number]   HPI Patient presents stating that the spot between his left fourth and fifth toe is killing him and he is ready to have it fixed.  States his Achilles doing well we did do surgery on the right which did excellent and he wants to get this when taken care of   ROS      Objective:  Physical Exam  Neurovascular status intact with chronic keratotic lesion fourth interspace left hypertrophy of the head of the proximal phalanx digit 5 left with pain occurring in between     Assessment:  Chronic interspace lesion fourth interspace left with pain along with hammertoe deformity fifth left with pressure     Plan:  H&P reviewed condition allowed him to read consent form going over alternative treatments and complications associated with surgery.  I did explain to him the risk of the procedure and recovery and he wants to have this done understanding everything as outlined.  Patient scheduled outpatient surgery all questions answered and he will have this done on an outpatient basis

## 2022-03-22 MED ORDER — HYDROCODONE-ACETAMINOPHEN 10-325 MG PO TABS: 1.0000 | ORAL_TABLET | Freq: Three times a day (TID) | ORAL | 0 refills | Status: AC | PRN

## 2022-03-22 NOTE — Addendum Note (Signed)
Addended by: Wallene Huh on: 03/22/2022 11:59 AM   Modules accepted: Orders

## 2022-03-23 ENCOUNTER — Encounter: Payer: Self-pay | Admitting: Podiatry

## 2022-03-23 ENCOUNTER — Other Ambulatory Visit (HOSPITAL_COMMUNITY): Payer: Self-pay

## 2022-03-23 DIAGNOSIS — M2042 Other hammer toe(s) (acquired), left foot: Secondary | ICD-10-CM | POA: Diagnosis not present

## 2022-03-23 DIAGNOSIS — D492 Neoplasm of unspecified behavior of bone, soft tissue, and skin: Secondary | ICD-10-CM | POA: Diagnosis not present

## 2022-03-23 DIAGNOSIS — D2122 Benign neoplasm of connective and other soft tissue of left lower limb, including hip: Secondary | ICD-10-CM | POA: Diagnosis not present

## 2022-03-31 ENCOUNTER — Other Ambulatory Visit: Payer: Self-pay | Admitting: Podiatry

## 2022-03-31 ENCOUNTER — Ambulatory Visit (INDEPENDENT_AMBULATORY_CARE_PROVIDER_SITE_OTHER): Payer: 59

## 2022-03-31 ENCOUNTER — Ambulatory Visit: Payer: 59

## 2022-03-31 ENCOUNTER — Encounter: Payer: Self-pay | Admitting: Podiatry

## 2022-03-31 ENCOUNTER — Telehealth: Payer: Self-pay

## 2022-03-31 DIAGNOSIS — Z09 Encounter for follow-up examination after completed treatment for conditions other than malignant neoplasm: Secondary | ICD-10-CM | POA: Diagnosis not present

## 2022-03-31 MED ORDER — HYDROCODONE-ACETAMINOPHEN 10-325 MG PO TABS
1.0000 | ORAL_TABLET | Freq: Three times a day (TID) | ORAL | 0 refills | Status: AC | PRN
  Filled 2022-03-31: qty 15, 5d supply, fill #0

## 2022-03-31 NOTE — Progress Notes (Signed)
Patient seen today for post op visit #1- Hammertoe repair 5th left foot. Xrays obtained today. Patient denies nausea, vomiting, fever, chills. Dressing removed. Stitches intact. Patient stated pain is minimal but at times he does get pain to foot. Nurse cleansed area, dressed toe with iodine followed by DSD. Patient requesting a refill on pain medication, notified patient that the request will be send and someone will call him with more information. Patient wondering if he could return to work on Monday. Patient told that if his job allows his to be on light duty he may return to work on Monday but they are not able to accomate him he should wait 2 weeks after he is seen by Dr. Paulla Dolly. Patient understood and agreed.  Advised patient to contact office with any problems or concerns. If he experiences any signs and symptoms of infection to go to the ED.

## 2022-04-01 ENCOUNTER — Other Ambulatory Visit (HOSPITAL_COMMUNITY): Payer: Self-pay

## 2022-04-14 ENCOUNTER — Encounter: Payer: Self-pay | Admitting: Podiatry

## 2022-04-14 ENCOUNTER — Other Ambulatory Visit (HOSPITAL_COMMUNITY): Payer: Self-pay

## 2022-04-14 ENCOUNTER — Ambulatory Visit (INDEPENDENT_AMBULATORY_CARE_PROVIDER_SITE_OTHER): Payer: 59 | Admitting: Podiatry

## 2022-04-14 ENCOUNTER — Ambulatory Visit (INDEPENDENT_AMBULATORY_CARE_PROVIDER_SITE_OTHER): Payer: 59

## 2022-04-14 DIAGNOSIS — Z09 Encounter for follow-up examination after completed treatment for conditions other than malignant neoplasm: Secondary | ICD-10-CM | POA: Diagnosis not present

## 2022-04-14 MED ORDER — DOXYCYCLINE HYCLATE 100 MG PO TABS
100.0000 mg | ORAL_TABLET | Freq: Two times a day (BID) | ORAL | 0 refills | Status: DC
  Filled 2022-04-14: qty 20, 10d supply, fill #0

## 2022-04-14 NOTE — Progress Notes (Signed)
Subjective:   Patient ID: Timothy Stewart, male   DOB: 51 y.o.   MRN: [id number]   HPI Patient states doing well with surgery and has been wearing the dressing   ROS      Objective:  Physical Exam  Neurovascular status intact fourth interspace left is healing well but there is some gapping of the incision which can occur with this type of the surgery and summer where there is quite a bit of increased heat.  No current drainage noted     Assessment:  Doing well post surgery but I am slightly concerned by some gapping of the incision with an absence of active infection     Plan:  H&P explained this to him and went ahead today and remove stitches and did note slight gapping.  I reapplied sterile dressing with Betadine instructed on him doing that also himself with dry dressing after a week and is precautionary measure since there are some slight gapping I did place on antibiotic doxycycline.  I gave him instructions of any erythema edema drainage or pathology were to occur to let us know immediately  X-rays indicate satisfactory removal had a proximal phalanx digit 5 left good alignment

## 2022-04-19 ENCOUNTER — Encounter: Payer: 59 | Admitting: Podiatry

## 2022-04-19 DIAGNOSIS — Z23 Encounter for immunization: Secondary | ICD-10-CM | POA: Diagnosis not present

## 2022-04-21 ENCOUNTER — Encounter: Payer: 59 | Admitting: Podiatry

## 2022-04-30 ENCOUNTER — Other Ambulatory Visit (HOSPITAL_COMMUNITY): Payer: Self-pay

## 2022-05-10 ENCOUNTER — Ambulatory Visit (INDEPENDENT_AMBULATORY_CARE_PROVIDER_SITE_OTHER): Payer: 59 | Admitting: Podiatry

## 2022-05-10 ENCOUNTER — Ambulatory Visit (INDEPENDENT_AMBULATORY_CARE_PROVIDER_SITE_OTHER): Payer: 59

## 2022-05-10 ENCOUNTER — Encounter: Payer: Self-pay | Admitting: Podiatry

## 2022-05-10 DIAGNOSIS — Z9889 Other specified postprocedural states: Secondary | ICD-10-CM

## 2022-05-10 NOTE — Progress Notes (Signed)
Subjective:   Patient ID: Timothy Stewart, male   DOB: 51 y.o.   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

## 2022-06-03 NOTE — Telephone Encounter (Signed)
Prescriptions was sent by provider. Task completed.

## 2022-07-15 ENCOUNTER — Other Ambulatory Visit (HOSPITAL_COMMUNITY): Payer: Self-pay

## 2022-07-16 ENCOUNTER — Other Ambulatory Visit (HOSPITAL_COMMUNITY): Payer: Self-pay

## 2022-07-19 ENCOUNTER — Other Ambulatory Visit (HOSPITAL_COMMUNITY): Payer: Self-pay

## 2022-07-19 MED ORDER — TADALAFIL 20 MG PO TABS
ORAL_TABLET | ORAL | 1 refills | Status: DC
  Filled 2022-07-19 – 2022-07-21 (×2): qty 90, 90d supply, fill #0
  Filled 2022-07-31: qty 30, 30d supply, fill #0
  Filled 2022-09-03: qty 30, 30d supply, fill #1
  Filled 2022-11-17: qty 30, 30d supply, fill #2
  Filled 2023-01-14 (×2): qty 30, 30d supply, fill #3
  Filled 2023-02-23: qty 30, 30d supply, fill #4
  Filled 2023-03-22 – 2023-03-26 (×5): qty 30, 30d supply, fill #5

## 2022-07-21 ENCOUNTER — Other Ambulatory Visit (HOSPITAL_COMMUNITY): Payer: Self-pay

## 2022-07-22 ENCOUNTER — Other Ambulatory Visit (HOSPITAL_COMMUNITY): Payer: Self-pay

## 2022-07-31 ENCOUNTER — Other Ambulatory Visit (HOSPITAL_COMMUNITY): Payer: Self-pay

## 2022-08-23 DIAGNOSIS — H524 Presbyopia: Secondary | ICD-10-CM | POA: Diagnosis not present

## 2022-09-02 ENCOUNTER — Other Ambulatory Visit (HOSPITAL_COMMUNITY): Payer: Self-pay

## 2022-09-02 DIAGNOSIS — R252 Cramp and spasm: Secondary | ICD-10-CM | POA: Diagnosis not present

## 2022-09-02 DIAGNOSIS — F439 Reaction to severe stress, unspecified: Secondary | ICD-10-CM | POA: Diagnosis not present

## 2022-09-02 DIAGNOSIS — I10 Essential (primary) hypertension: Secondary | ICD-10-CM | POA: Diagnosis not present

## 2022-09-02 DIAGNOSIS — G4700 Insomnia, unspecified: Secondary | ICD-10-CM | POA: Diagnosis not present

## 2022-09-02 DIAGNOSIS — F430 Acute stress reaction: Secondary | ICD-10-CM | POA: Diagnosis not present

## 2022-09-02 DIAGNOSIS — Z566 Other physical and mental strain related to work: Secondary | ICD-10-CM | POA: Diagnosis not present

## 2022-09-02 DIAGNOSIS — F17200 Nicotine dependence, unspecified, uncomplicated: Secondary | ICD-10-CM | POA: Diagnosis not present

## 2022-09-02 MED ORDER — AMLODIPINE BESYLATE-VALSARTAN 10-320 MG PO TABS
1.0000 | ORAL_TABLET | Freq: Every morning | ORAL | 0 refills | Status: DC
  Filled 2022-09-02: qty 90, 90d supply, fill #0

## 2022-09-02 MED ORDER — TRAZODONE HCL 50 MG PO TABS
25.0000 mg | ORAL_TABLET | Freq: Every day | ORAL | 1 refills | Status: AC
  Filled 2022-09-02: qty 60, 30d supply, fill #0

## 2022-09-03 ENCOUNTER — Other Ambulatory Visit (HOSPITAL_COMMUNITY): Payer: Self-pay

## 2022-09-10 ENCOUNTER — Other Ambulatory Visit (HOSPITAL_COMMUNITY): Payer: Self-pay

## 2022-09-21 ENCOUNTER — Other Ambulatory Visit (HOSPITAL_COMMUNITY): Payer: Self-pay

## 2022-09-21 DIAGNOSIS — F17200 Nicotine dependence, unspecified, uncomplicated: Secondary | ICD-10-CM | POA: Diagnosis not present

## 2022-09-21 DIAGNOSIS — J988 Other specified respiratory disorders: Secondary | ICD-10-CM | POA: Diagnosis not present

## 2022-09-21 DIAGNOSIS — Z566 Other physical and mental strain related to work: Secondary | ICD-10-CM | POA: Diagnosis not present

## 2022-09-21 DIAGNOSIS — I10 Essential (primary) hypertension: Secondary | ICD-10-CM | POA: Diagnosis not present

## 2022-09-21 DIAGNOSIS — F439 Reaction to severe stress, unspecified: Secondary | ICD-10-CM | POA: Diagnosis not present

## 2022-09-21 MED ORDER — TRIAMTERENE-HCTZ 37.5-25 MG PO TABS
1.0000 | ORAL_TABLET | Freq: Every morning | ORAL | 1 refills | Status: DC
  Filled 2022-09-21: qty 30, 30d supply, fill #0

## 2022-09-21 MED ORDER — AMLODIPINE BESYLATE-VALSARTAN 10-320 MG PO TABS
1.0000 | ORAL_TABLET | Freq: Every morning | ORAL | 0 refills | Status: DC
  Filled 2022-09-21: qty 90, 90d supply, fill #0

## 2022-09-21 MED ORDER — AMOXICILLIN-POT CLAVULANATE 875-125 MG PO TABS
1.0000 | ORAL_TABLET | Freq: Two times a day (BID) | ORAL | 0 refills | Status: DC
  Filled 2022-09-21: qty 20, 10d supply, fill #0

## 2022-09-21 MED ORDER — ALBUTEROL SULFATE HFA 108 (90 BASE) MCG/ACT IN AERS
2.0000 | INHALATION_SPRAY | Freq: Four times a day (QID) | RESPIRATORY_TRACT | 5 refills | Status: AC | PRN
  Filled 2022-09-21: qty 6.7, 25d supply, fill #0

## 2022-09-28 ENCOUNTER — Other Ambulatory Visit (HOSPITAL_COMMUNITY): Payer: Self-pay

## 2022-09-28 DIAGNOSIS — F439 Reaction to severe stress, unspecified: Secondary | ICD-10-CM | POA: Diagnosis not present

## 2022-09-28 DIAGNOSIS — Z566 Other physical and mental strain related to work: Secondary | ICD-10-CM | POA: Diagnosis not present

## 2022-09-28 DIAGNOSIS — I10 Essential (primary) hypertension: Secondary | ICD-10-CM | POA: Diagnosis not present

## 2022-09-28 MED ORDER — AMLODIPINE BESYLATE-VALSARTAN 10-320 MG PO TABS
1.0000 | ORAL_TABLET | Freq: Every morning | ORAL | 1 refills | Status: DC
  Filled 2022-09-28 – 2022-11-17 (×2): qty 90, 90d supply, fill #0

## 2022-09-28 MED ORDER — TRIAMTERENE-HCTZ 37.5-25 MG PO TABS
1.0000 | ORAL_TABLET | Freq: Every morning | ORAL | 1 refills | Status: AC
  Filled 2022-09-28 – 2022-11-17 (×2): qty 90, 90d supply, fill #0
  Filled 2023-02-23: qty 30, 30d supply, fill #1
  Filled 2023-03-22 – 2023-03-26 (×5): qty 30, 30d supply, fill #2
  Filled 2023-04-18: qty 30, 30d supply, fill #0

## 2022-10-11 DIAGNOSIS — Z566 Other physical and mental strain related to work: Secondary | ICD-10-CM | POA: Diagnosis not present

## 2022-10-11 DIAGNOSIS — F439 Reaction to severe stress, unspecified: Secondary | ICD-10-CM | POA: Diagnosis not present

## 2022-10-11 DIAGNOSIS — F430 Acute stress reaction: Secondary | ICD-10-CM | POA: Diagnosis not present

## 2022-10-11 DIAGNOSIS — I10 Essential (primary) hypertension: Secondary | ICD-10-CM | POA: Diagnosis not present

## 2022-11-05 DIAGNOSIS — R55 Syncope and collapse: Secondary | ICD-10-CM | POA: Diagnosis not present

## 2022-11-05 DIAGNOSIS — F439 Reaction to severe stress, unspecified: Secondary | ICD-10-CM | POA: Diagnosis not present

## 2022-11-05 DIAGNOSIS — I10 Essential (primary) hypertension: Secondary | ICD-10-CM | POA: Diagnosis not present

## 2022-11-17 ENCOUNTER — Other Ambulatory Visit (HOSPITAL_COMMUNITY): Payer: Self-pay

## 2022-11-18 ENCOUNTER — Other Ambulatory Visit: Payer: Self-pay

## 2022-11-18 ENCOUNTER — Other Ambulatory Visit (HOSPITAL_COMMUNITY): Payer: Self-pay

## 2022-11-18 NOTE — Progress Notes (Deleted)
Cardiology Office Note:    Date:  11/18/2022   ID:  Timothy Stewart, DOB November 10, 1970, MRN [id number]  PCP:  Janie Morning, [place] Providers Cardiologist:  None { Click to update primary MD,subspecialty MD or APP then REFRESH:1}    Referring MD: Janie Morning, DO   No chief complaint on file. ***  History of Present Illness:    Timothy Stewart is a 52 y.o. male with a hx of HTN, referral for syncope. No cardiac w/u hx.   Past Medical History:  Diagnosis Date   Allergic reaction    Hypertension     Past Surgical History:  Procedure Laterality Date   ABDOMINAL SURGERY     BACK SURGERY      Current Medications: No outpatient medications have been marked as taking for the 11/19/22 encounter (Appointment) with Janina Mayo, MD.   Current Facility-Administered Medications for the 11/19/22 encounter (Appointment) with Janina Mayo, MD  Medication   nitroGLYCERIN (NITRODUR - Dosed in mg/24 hr) patch 0.2 mg     Allergies:   Lisinopril and Codeine   Social History   Socioeconomic History   Marital status: Divorced    Spouse name: Not on file   Number of children: Not on file   Years of education: Not on file   Highest education level: Not on file  Occupational History   Not on file  Tobacco Use   Smoking status: Every Day    Packs/day: 1.00    Types: Cigarettes   Smokeless tobacco: Never  Substance and Sexual Activity   Alcohol use: Yes    Comment: "I don't drink like that"   Drug use: Yes    Types: Marijuana, Cocaine   Sexual activity: Not on file  Other Topics Concern   Not on file  Social History Narrative   Not on file   Social Determinants of Health   Financial Resource Strain: Not on file  Food Insecurity: Not on file  Transportation Needs: Not on file  Physical Activity: Not on file  Stress: Not on file  Social Connections: Not on file     Family History: The patient's ***family history is not on file.  ROS:   Please  see the history of present illness.    *** All other systems reviewed and are negative.  EKGs/Labs/Other Studies Reviewed:    The following studies were reviewed today: ***  EKG:  EKG is *** ordered today.  The ekg ordered today demonstrates ***  Recent Labs: No results found for requested labs within last 365 days.  Recent Lipid Panel No results found for: "CHOL", "TRIG", "HDL", "CHOLHDL", "VLDL", "LDLCALC", "LDLDIRECT"   Risk Assessment/Calculations:   {Does this patient have ATRIAL FIBRILLATION?[phone]}  No BP recorded.  {Refresh Note OR Click here to enter BP  :1}***         Physical Exam:    VS:  There were no vitals taken for this visit.    Wt Readings from Last 3 Encounters:  11/26/13 205 lb (93 kg)     GEN: *** Well nourished, well developed in no acute distress HEENT: Normal NECK: No JVD; No carotid bruits LYMPHATICS: No lymphadenopathy CARDIAC: ***RRR, no murmurs, rubs, gallops RESPIRATORY:  Clear to auscultation without rales, wheezing or rhonchi  ABDOMEN: Soft, non-tender, non-distended MUSCULOSKELETAL:  No edema; No deformity  SKIN: Warm and dry NEUROLOGIC:  Alert and oriented x 3 PSYCHIATRIC:  Normal affect   ASSESSMENT:    No  diagnosis found. PLAN:    In order of problems listed above:  ***      {Are you ordering a CV Procedure (e.g. stress test, cath, DCCV, TEE, etc)?   Press F2        :[id number]    Medication Adjustments/Labs and Tests Ordered: Current medicines are reviewed at length with the patient today.  Concerns regarding medicines are outlined above.  No orders of the defined types were placed in this encounter.  No orders of the defined types were placed in this encounter.   There are no Patient Instructions on file for this visit.   Signed, Janina Mayo, MD  11/18/2022 2:03 PM    [place]

## 2022-11-19 ENCOUNTER — Ambulatory Visit: Payer: Self-pay | Attending: Internal Medicine | Admitting: Internal Medicine

## 2022-12-02 NOTE — Progress Notes (Signed)
Cardiology Office Note:    Date:  12/03/2022   ID:  Timothy Stewart, DOB [birth year], MRN [id number]  PCP:  Janie Morning, [facility] Cardiologist:  Janina Mayo, MD     Referring MD: Janie Morning, DO   No chief complaint on file. Syncope  History of Present Illness:    Timothy Stewart is a 52 y.o. male with a hx of HTN, referral for syncope. No cardiac dx hx. He was seen by his PCP Dr. Theda Sers. He said several years ago, he was getting something in the kitchen and fell. He noted an episode of "passing out" while in the kitchen,. He was getting something out of the cabinet, then he fell. Grandmother died of an MI, 55 years old. Smoking cigarettes, he's trying to quit.   Past Medical History:  Diagnosis Date   Allergic reaction    Hypertension     Past Surgical History:  Procedure Laterality Date   ABDOMINAL SURGERY     BACK SURGERY      Current Medications: Current Outpatient Medications on File Prior to Visit  Medication Sig Dispense Refill   albuterol (VENTOLIN HFA) 108 (90 Base) MCG/ACT inhaler Inhale 2 puffs into the lungs every 6 (six) hours as needed. 6.7 g 5   amLODipine (NORVASC) 10 MG tablet Take 1 tablet (10 mg total) by mouth daily. 10 tablet 0   diclofenac (VOLTAREN) 75 MG EC tablet Take 1 tablet (75 mg total) by mouth 2 (two) times daily. 50 tablet 2   EPINEPHrine (EPIPEN 2-PAK) 0.3 mg/0.3 mL SOAJ injection Inject 0.3 mLs (0.3 mg total) into the muscle once as needed (for severe allergic reaction). CAll 911 immediately if you have to use this medicine 1 Device 1   tadalafil (CIALIS) 20 MG tablet Take 1 tablet (20 mg total) by mouth daily as needed. 90 tablet 1   terbinafine (LAMISIL) 250 MG tablet Take 1 tablet (250 mg total) by mouth daily. 45 tablet 0   traZODone (DESYREL) 50 MG tablet Take 0.5-2 tablets (25-100 mg total) by mouth daily 1 hour before bed for sleep 60 tablet 1   triamterene-hydrochlorothiazide (MAXZIDE-25) 37.5-25 MG  tablet Take 1 tablet by mouth in the morning for blood pressure 90 tablet 1   Current Facility-Administered Medications on File Prior to Visit  Medication Dose Route Frequency Provider Last Rate Last Admin   nitroGLYCERIN (NITRODUR - Dosed in mg/24 hr) patch 0.2 mg  0.2 mg Transdermal Daily Regal, Tamala Fothergill, DPM         Allergies:   Lisinopril and Codeine   Social History   Socioeconomic History   Marital status: Divorced    Spouse name: Not on file   Number of children: Not on file   Years of education: Not on file   Highest education level: Not on file  Occupational History   Not on file  Tobacco Use   Smoking status: Every Day    Packs/day: 1.00    Types: Cigarettes   Smokeless tobacco: Never  Substance and Sexual Activity   Alcohol use: Yes    Comment: "I don't drink like that"   Drug use: Yes    Types: Marijuana, Cocaine   Sexual activity: Not on file  Other Topics Concern   Not on file  Social History Narrative   Not on file   Social Determinants of Health   Financial Resource Strain: Not on file  Food Insecurity: Not on file  Transportation  Needs: Not on file  Physical Activity: Not on file  Stress: Not on file  Social Connections: Not on file     Family History: Per above  ROS:   Please see the history of present illness.     All other systems reviewed and are negative.  EKGs/Labs/Other Studies Reviewed:    The following studies were reviewed today:   EKG:  EKG is  ordered today.  The ekg ordered today demonstrates   12/03/2022- NSR, early repol, LAE  Recent Labs: No results found for requested labs within last 365 days.  Recent Lipid Panel No results found for: "CHOL", "TRIG", "HDL", "CHOLHDL", "VLDL", "LDLCALC", "LDLDIRECT"   Risk Assessment/Calculations:     Physical Exam:    VS:  Vitals:   12/03/22 1450  BP: (!) 142/82  Pulse: 64  SpO2: 98%     Wt Readings from Last 3 Encounters:  12/03/22 182 lb 6.4 oz (82.7 kg)  11/26/13  205 lb (93 kg)     GEN:  Well nourished, well developed in no acute distress HEENT: Normal NECK: No JVD; No carotid bruits LYMPHATICS: No lymphadenopathy CARDIAC: RRR, no murmurs, rubs, gallops RESPIRATORY:  Clear to auscultation without rales, wheezing or rhonchi  ABDOMEN: Soft, non-tender, non-distended MUSCULOSKELETAL:  No edema; No deformity  SKIN: Warm and dry NEUROLOGIC:  Alert and oriented x 3 PSYCHIATRIC:  Normal affect   ASSESSMENT:   Syncope: TTE and ziopatch to assess for structural heart disease or arrhythmia. No murmur to suggest HOCM.   HTN: BP good at home. continue amlodipine 10 mg daily. Triametene-HCTz 37.5-25 mg PLAN:    In order of problems listed above:  TTE 2 week ziopatch Follow up pending results        Medication Adjustments/Labs and Tests Ordered: Current medicines are reviewed at length with the patient today.  Concerns regarding medicines are outlined above.  Orders Placed This Encounter  Procedures   LONG TERM MONITOR (3-14 DAYS)   EKG 12-Lead   ECHOCARDIOGRAM COMPLETE   No orders of the defined types were placed in this encounter.   Patient Instructions  Medication Instructions:  No Changes In Medications at this time.  *If you need a refill on your cardiac medications before your next appointment, please call your pharmacy*  Lab Work: None Ordered At This Time.  If you have labs (blood work) drawn today and your tests are completely normal, you will receive your results only by: [place] (if you have MyChart) OR A paper copy in the mail If you have any lab test that is abnormal or we need to change your treatment, we will call you to review the results.  Testing/Procedures: Your physician has requested that you have an echocardiogram. Echocardiography is a painless test that uses sound waves to create images of your heart. It provides your doctor with information about the size and shape of your heart and how well your  heart's chambers and valves are working. You may receive an ultrasound enhancing agent through an IV if needed to better visualize your heart during the echo.This procedure takes approximately one hour. There are no restrictions for this procedure. This will take place at the [address].    ZIO XT- Long Term Monitor Instructions   Your physician has requested you wear your ZIO patch monitor___14____days.   This is a single patch monitor.  [employer] supplies one patch monitor per enrollment.  Additional stickers are not available.   Please do not  apply patch if you will be having a Nuclear Stress Test, Echocardiogram, Cardiac CT, MRI, or Chest Xray during the time frame you would be wearing the monitor. The patch cannot be worn during these tests.  You cannot remove and re-apply the ZIO XT patch monitor.   Your ZIO patch monitor will be sent USPS Priority mail from [facility] directly to your home address. The monitor may also be mailed to a PO BOX if home delivery is not available.   It may take 3-5 days to receive your monitor after you have been enrolled.   Once you have received you monitor, please review enclosed instructions.  Your monitor has already been registered assigning a specific monitor serial # to you.   Applying the monitor   Shave hair from upper left chest.   Hold abrader disc by orange tab.  Rub abrader in 40 strokes over left upper chest as indicated in your monitor instructions.   Clean area with 4 enclosed alcohol pads .  Use all pads to assure are is cleaned thoroughly.  Let dry.   Apply patch as indicated in monitor instructions.  Patch will be place under collarbone on left side of chest with arrow pointing upward.   Rub patch adhesive wings for 2 minutes.Remove white label marked "1".  Remove white label marked "2".  Rub patch adhesive wings for 2 additional minutes.   While looking in a mirror, press and release button in center of  patch.  A small green light will flash 3-4 times .  This will be your only indicator the monitor has been turned on.     Do not shower for the first 24 hours.  You may shower after the first 24 hours.   Press button if you feel a symptom. You will hear a small click.  Record Date, Time and Symptom in the Patient Log Book.   When you are ready to remove patch, follow instructions on last 2 pages of Patient Log Book.  Stick patch monitor onto last page of Patient Log Book.   Place Patient Log Book in [place] box.  Use locking tab on box and tape box closed securely.  The [employer] has [employer] on it.  Please place in mailbox as soon as possible.  Your physician should have your test results approximately 7 days after the monitor has been mailed back to [facility]   Call [place] at [phone] if you have questions regarding your ZIO XT patch monitor.  Call them immediately if you see an orange light blinking on your monitor.   If your monitor falls off in less than 4 days contact our Monitor department at [phone].  If your monitor becomes loose or falls off after 4 days call [employer] at [phone] for suggestions on securing your monitor.    Follow-Up: At [facility], you and your health needs are our priority.  As part of our continuing mission to provide you with exceptional heart care, we have created designated Provider Care Teams.  These Care Teams include your primary Cardiologist (physician) and Advanced Practice Providers (APPs -  Physician Assistants and Nurse Practitioners) who all work together to provide you with the care you need, when you need it.  Your next appointment:   AS NEEDED   Provider:   Janina Mayo, MD      Signed, Janina Mayo, MD  12/03/2022 4:24 PM    [place]

## 2022-12-03 ENCOUNTER — Ambulatory Visit: Payer: 59 | Attending: Internal Medicine | Admitting: Internal Medicine

## 2022-12-03 ENCOUNTER — Ambulatory Visit: Payer: BLUE CROSS/BLUE SHIELD | Attending: Internal Medicine

## 2022-12-03 ENCOUNTER — Encounter: Payer: Self-pay | Admitting: Internal Medicine

## 2022-12-03 VITALS — BP 142/82 | HR 64 | Ht 71.5 in | Wt 182.4 lb

## 2022-12-03 DIAGNOSIS — R55 Syncope and collapse: Secondary | ICD-10-CM

## 2022-12-03 NOTE — Patient Instructions (Signed)
Medication Instructions:  No Changes In Medications at this time.  *If you need a refill on your cardiac medications before your next appointment, please call your pharmacy*  Lab Work: None Ordered At This Time.  If you have labs (blood work) drawn today and your tests are completely normal, you will receive your results only by: [place] (if you have MyChart) OR A paper copy in the mail If you have any lab test that is abnormal or we need to change your treatment, we will call you to review the results.  Testing/Procedures: Your physician has requested that you have an echocardiogram. Echocardiography is a painless test that uses sound waves to create images of your heart. It provides your doctor with information about the size and shape of your heart and how well your heart's chambers and valves are working. You may receive an ultrasound enhancing agent through an IV if needed to better visualize your heart during the echo.This procedure takes approximately one hour. There are no restrictions for this procedure. This will take place at the [address], [address].    ZIO XT- Long Term Monitor Instructions   Your physician has requested you wear your ZIO patch monitor___14____days.   This is a single patch monitor.  [employer] supplies one patch monitor per enrollment.  Additional stickers are not available.   Please do not apply patch if you will be having a Nuclear Stress Test, Echocardiogram, Cardiac CT, MRI, or Chest Xray during the time frame you would be wearing the monitor. The patch cannot be worn during these tests.  You cannot remove and re-apply the ZIO XT patch monitor.   Your ZIO patch monitor will be sent USPS Priority mail from [facility] directly to your home address. The monitor may also be mailed to a PO BOX if home delivery is not available.   It may take 3-5 days to receive your monitor after you have been enrolled.   Once you have received you  monitor, please review enclosed instructions.  Your monitor has already been registered assigning a specific monitor serial # to you.   Applying the monitor   Shave hair from upper left chest.   Hold abrader disc by orange tab.  Rub abrader in 40 strokes over left upper chest as indicated in your monitor instructions.   Clean area with 4 enclosed alcohol pads .  Use all pads to assure are is cleaned thoroughly.  Let dry.   Apply patch as indicated in monitor instructions.  Patch will be place under collarbone on left side of chest with arrow pointing upward.   Rub patch adhesive wings for 2 minutes.Remove white label marked "1".  Remove white label marked "2".  Rub patch adhesive wings for 2 additional minutes.   While looking in a mirror, press and release button in center of patch.  A small green light will flash 3-4 times .  This will be your only indicator the monitor has been turned on.     Do not shower for the first 24 hours.  You may shower after the first 24 hours.   Press button if you feel a symptom. You will hear a small click.  Record Date, Time and Symptom in the Patient Log Book.   When you are ready to remove patch, follow instructions on last 2 pages of Patient Log Book.  Stick patch monitor onto last page of Patient Log Book.   Place Patient Log Book in [facility]  box.  Use locking tab on box and tape box closed securely.  The [employer] has [employer] on it.  Please place in mailbox as soon as possible.  Your physician should have your test results approximately 7 days after the monitor has been mailed back to [facility].   Call [place] at [phone] if you have questions regarding your ZIO XT patch monitor.  Call them immediately if you see an orange light blinking on your monitor.   If your monitor falls off in less than 4 days contact our Monitor department at [phone].  If your monitor becomes loose or falls off after 4 days  call [employer] at [phone] for suggestions on securing your monitor.    Follow-Up: At [facility], you and your health needs are our priority.  As part of our continuing mission to provide you with exceptional heart care, we have created designated Provider Care Teams.  These Care Teams include your primary Cardiologist (physician) and Advanced Practice Providers (APPs -  Physician Assistants and Nurse Practitioners) who all work together to provide you with the care you need, when you need it.  Your next appointment:   AS NEEDED   Provider:   Janina Mayo, MD

## 2022-12-03 NOTE — Progress Notes (Unsigned)
Enrolled patient for a 14 day Zio XT  monitor to be mailed to patients home  °

## 2022-12-08 DIAGNOSIS — R55 Syncope and collapse: Secondary | ICD-10-CM

## 2022-12-31 ENCOUNTER — Ambulatory Visit (HOSPITAL_COMMUNITY): Payer: BLUE CROSS/BLUE SHIELD | Attending: Internal Medicine

## 2022-12-31 DIAGNOSIS — R55 Syncope and collapse: Secondary | ICD-10-CM | POA: Diagnosis present

## 2022-12-31 LAB — ECHOCARDIOGRAM COMPLETE
Area-P 1/2: 3.01 cm2
P 1/2 time: 541 msec
S' Lateral: 3.2 cm

## 2023-01-14 ENCOUNTER — Other Ambulatory Visit (HOSPITAL_COMMUNITY): Payer: Self-pay

## 2023-01-14 ENCOUNTER — Encounter (HOSPITAL_COMMUNITY): Payer: Self-pay

## 2023-01-18 ENCOUNTER — Other Ambulatory Visit: Payer: Self-pay

## 2023-01-20 ENCOUNTER — Other Ambulatory Visit (HOSPITAL_COMMUNITY): Payer: Self-pay

## 2023-02-23 ENCOUNTER — Other Ambulatory Visit: Payer: Self-pay

## 2023-02-23 ENCOUNTER — Other Ambulatory Visit (HOSPITAL_COMMUNITY): Payer: Self-pay

## 2023-02-25 ENCOUNTER — Other Ambulatory Visit (HOSPITAL_COMMUNITY): Payer: Self-pay

## 2023-03-22 ENCOUNTER — Encounter: Payer: Self-pay | Admitting: Pharmacist

## 2023-03-22 ENCOUNTER — Other Ambulatory Visit: Payer: Self-pay

## 2023-03-22 ENCOUNTER — Other Ambulatory Visit (HOSPITAL_COMMUNITY): Payer: Self-pay

## 2023-03-23 ENCOUNTER — Other Ambulatory Visit: Payer: Self-pay

## 2023-03-25 ENCOUNTER — Other Ambulatory Visit (HOSPITAL_COMMUNITY): Payer: Self-pay

## 2023-03-26 ENCOUNTER — Other Ambulatory Visit (HOSPITAL_COMMUNITY): Payer: Self-pay

## 2023-03-28 ENCOUNTER — Other Ambulatory Visit (HOSPITAL_COMMUNITY): Payer: Self-pay

## 2023-03-30 ENCOUNTER — Emergency Department (HOSPITAL_BASED_OUTPATIENT_CLINIC_OR_DEPARTMENT_OTHER): Payer: 59 | Admitting: Radiology

## 2023-03-30 ENCOUNTER — Other Ambulatory Visit: Payer: Self-pay

## 2023-03-30 ENCOUNTER — Emergency Department (HOSPITAL_BASED_OUTPATIENT_CLINIC_OR_DEPARTMENT_OTHER)
Admission: EM | Admit: 2023-03-30 | Discharge: 2023-03-30 | Disposition: A | Discharge status: Home / Self Care | Payer: 59 | Attending: Emergency Medicine | Admitting: Emergency Medicine

## 2023-03-30 ENCOUNTER — Encounter (HOSPITAL_BASED_OUTPATIENT_CLINIC_OR_DEPARTMENT_OTHER): Payer: Self-pay

## 2023-03-30 DIAGNOSIS — W230XXA Caught, crushed, jammed, or pinched between moving objects, initial encounter: Secondary | ICD-10-CM | POA: Diagnosis not present

## 2023-03-30 DIAGNOSIS — S6291XA Unspecified fracture of right wrist and hand, initial encounter for closed fracture: Secondary | ICD-10-CM | POA: Diagnosis not present

## 2023-03-30 DIAGNOSIS — S6991XA Unspecified injury of right wrist, hand and finger(s), initial encounter: Secondary | ICD-10-CM | POA: Diagnosis present

## 2023-03-30 MED ORDER — HYDROCODONE-ACETAMINOPHEN 5-325 MG PO TABS
1.0000 | ORAL_TABLET | Freq: Once | ORAL | Status: AC
  Administered 2023-03-30: 1 via ORAL
  Filled 2023-03-30: qty 1

## 2023-03-30 MED ORDER — HYDROCODONE-ACETAMINOPHEN 5-325 MG PO TABS: 1.0000 | ORAL_TABLET | ORAL | 0 refills | Status: AC | PRN

## 2023-03-30 MED ORDER — LIDOCAINE HCL (PF) 1 % IJ SOLN
5.0000 mL | Freq: Once | INTRAMUSCULAR | Status: AC
  Administered 2023-03-30: 5 mL
  Filled 2023-03-30: qty 5

## 2023-03-30 NOTE — Discharge Instructions (Addendum)
You were given a referral to Dr. Lovie Macadamia, hand specialist.  Please schedule an appointment with him in order to have follow-up for your right hand fracture and dislocation.  You were given a short course of pain medication, please take 1 tablet every 4 hours as needed for pain control.  Please do know that this medication contains Tylenol, you may add additional Motrin.

## 2023-03-30 NOTE — ED Provider Notes (Signed)
[facility] AT [facility] Provider Note   CSN: [id number] Arrival date & time: 03/30/23  1637     History  Chief Complaint  Patient presents with   Hand Injury    Timothy Stewart is a 52 y.o. male.  52 y.o male with no PMH presents to the ED pain status post right hand injury last night.  Patient reports he ran into a door last night. He is having pain to the pain especially with hand flexion. No alleviating factors. He has not taken anything for pain control.  Reports his last tetanus immunization was approximately a year ago.  There is a 2 patient is noted to the knuckles.Denies any other injury or complaint.   The history is provided by the patient and medical records.  Hand Injury Associated symptoms: no fever        Home Medications Prior to Admission medications   Medication Sig Start Date End Date Taking? Authorizing Provider  HYDROcodone-acetaminophen (NORCO/VICODIN) 5-325 MG tablet Take 1 tablet by mouth every 4 (four) hours as needed for up to 3 days. 03/30/23 04/02/23 Yes Kert Shackett, PA-C  albuterol (VENTOLIN HFA) 108 (90 Base) MCG/ACT inhaler Inhale 2 puffs into the lungs every 6 (six) hours as needed. 09/21/22     amLODipine (NORVASC) 10 MG tablet Take 1 tablet (10 mg total) by mouth daily. 06/07/19   Arthor Captain, PA-C  diclofenac (VOLTAREN) 75 MG EC tablet Take 1 tablet (75 mg total) by mouth 2 (two) times daily. 10/31/20   Lenn Sink, DPM  EPINEPHrine (EPIPEN 2-PAK) 0.3 mg/0.3 mL SOAJ injection Inject 0.3 mLs (0.3 mg total) into the muscle once as needed (for severe allergic reaction). CAll 911 immediately if you have to use this medicine 11/26/13   Roxy Horseman, PA-C  tadalafil (CIALIS) 20 MG tablet Take 1 tablet (20 mg total) by mouth daily as needed. 07/18/22     terbinafine (LAMISIL) 250 MG tablet Take 1 tablet (250 mg total) by mouth daily. 01/21/22   Lenn Sink, DPM  traZODone (DESYREL) 50 MG tablet Take 0.5-2 tablets  (25-100 mg total) by mouth daily 1 hour before bed for sleep 09/02/22     triamterene-hydrochlorothiazide (MAXZIDE-25) 37.5-25 MG tablet Take 1 tablet by mouth in the morning for blood pressure 09/28/22         Allergies    Lisinopril and Codeine    Review of Systems   Review of Systems  Constitutional:  Negative for fever.  Respiratory:  Negative for shortness of breath.   Cardiovascular:  Negative for chest pain.  Musculoskeletal:  Positive for arthralgias.    Physical Exam Updated Vital Signs BP (!) 184/102 (BP Location: Left Arm)   Pulse 63   Resp 18   SpO2 100%  Physical Exam Vitals and nursing note reviewed.  Constitutional:      Appearance: Normal appearance.  HENT:     Head: Normocephalic and atraumatic.     Mouth/Throat:     Mouth: Mucous membranes are moist.  Cardiovascular:     Rate and Rhythm: Normal rate.  Pulmonary:     Effort: Pulmonary effort is normal.  Abdominal:     General: Abdomen is flat.  Musculoskeletal:     Right hand: Swelling, tenderness and bony tenderness present. Decreased range of motion. Decreased strength of wrist extension. Normal sensation. There is no disruption of two-point discrimination. Normal capillary refill. Normal pulse.     Cervical back: Normal range of motion and neck  supple.     Comments: Radial 2+ pulses present, swelling to the dorsum aspect. Two abrasions present with no bleeding. Decrease ROM due to pain.   Skin:    General: Skin is warm and dry.     Findings: Bruising and erythema present.  Neurological:     Mental Status: He is alert and oriented to person, place, and time.     ED Results / Procedures / Treatments   Labs (all labs ordered are listed, but only abnormal results are displayed) Labs Reviewed - No data to display  EKG None  Radiology DG Hand 2 View Right  Result Date: 03/30/2023 CLINICAL DATA:  Trauma EXAM: RIGHT HAND - 2 VIEW COMPARISON:  Study done earlier today FINDINGS: There is dorsal  dislocation of the base of the fifth metacarpal in relation to the carpals. There is fracture dislocation in the foot carpometacarpal joint. There is 4 mm offset in alignment of fracture fragments in the base of the fourth metacarpal. There is some improvement in alignment of fracture fragments in the base of fourth metacarpal. There is mild deformity in the neck of fifth metacarpal without radiolucent line suggesting possible old healed fracture. Small bony spurs are seen first carpometacarpal joint. There is marked soft tissue swelling over the dorsum. IMPRESSION: There is dorsal dislocation of fifth metacarpal in relation to the distal row of carpals. There is fracture dislocation in the base of fourth metacarpal. There is some improvement in alignment of fracture fragments in the base of fourth metacarpal. Electronically Signed   By: Ernie Avena M.D.   On: 03/30/2023 18:44   DG Hand Complete Right  Result Date: 03/30/2023 CLINICAL DATA:  Patient states he punched a wall last night. Right hand swelling. EXAM: RIGHT HAND - COMPLETE 3+ VIEW COMPARISON:  None Available. FINDINGS: There is displaced oblique fracture through the base of the fourth metatarsal. There is dislocation at the fifth carpometacarpal joint. Marked soft tissue swelling about the dorsal and ulnar aspect of the hand. IMPRESSION: 1. Displaced oblique fracture through the base of the fourth metacarpal. 2. Dislocation at the fifth carpometacarpal joint. Nondisplaced fracture of the fifth metacarpal base can not be excluded. Electronically Signed   By: Larose Hires D.O.   On: 03/30/2023 17:27    Procedures Reduction of dislocation  Date/Time: 03/30/2023 6:55 PM  Performed by: Claude Manges, PA-C Authorized by: Claude Manges, PA-C  Consent: Verbal consent obtained. Consent given by: patient Patient identity confirmed: verbally with patient Local anesthesia used: yes Anesthesia: local infiltration  Anesthesia: Local anesthesia  used: yes Local Anesthetic: lidocaine 1% without epinephrine Anesthetic total: 5 mL  Sedation: Patient sedated: no  Patient tolerance: patient tolerated the procedure well with no immediate complications Comments: Improvement of alignment.        Medications Ordered in ED Medications  HYDROcodone-acetaminophen (NORCO/VICODIN) 5-325 MG per tablet 1 tablet (1 tablet Oral Given 03/30/23 1741)  lidocaine (PF) (XYLOCAINE) 1 % injection 5 mL (5 mLs Infiltration Given 03/30/23 1748)    ED Course/ Medical Decision Making/ A&P                             Medical Decision Making Amount and/or Complexity of Data Reviewed Radiology: ordered.  Risk Prescription drug management.  Patient presents to the ED status post right hand injury, after punching a door last night.  Swelling along with erythema and 2 abrasions noted to his knuckles without any active bleeding.  Tetanus immunizations up-to-date per patient.  Exam with decreased range of motion due to pain.  Xray of the right hand showed: IMPRESSION:  1. Displaced oblique fracture through the base of the fourth  metacarpal.  2. Dislocation at the fifth carpometacarpal joint. Nondisplaced  fracture of the fifth metacarpal base can not be excluded.   Improvement of dislocation per postreduction x-ray.  Patient placed in ulnar gutter splint.  We discussed pain control at home along with follow-up with hand specialist Dr. Orlan Leavens.  Patient is agreeable to plan and treatment, hemodynamically stable for discharge.    Portions of this note were generated with Scientist, clinical (histocompatibility and immunogenetics). Dictation errors may occur despite best attempts at proofreading.   Final Clinical Impression(s) / ED Diagnoses Final diagnoses:  Closed fracture of right hand, initial encounter    Rx / DC Orders ED Discharge Orders          Ordered    HYDROcodone-acetaminophen (NORCO/VICODIN) 5-325 MG tablet  Every 4 hours PRN        03/30/23 1854               Claude Manges, PA-C 03/30/23 1856    Long, Arlyss Repress, MD 04/05/23 [phone]

## 2023-03-30 NOTE — ED Triage Notes (Signed)
Pt states he punched a wall last night. Right hand swelling, +PMS on assessment.

## 2023-04-08 ENCOUNTER — Other Ambulatory Visit (HOSPITAL_COMMUNITY): Payer: Self-pay

## 2023-04-12 ENCOUNTER — Other Ambulatory Visit (HOSPITAL_COMMUNITY): Payer: Self-pay

## 2023-04-12 MED ORDER — TADALAFIL 20 MG PO TABS
20.0000 mg | ORAL_TABLET | Freq: Every day | ORAL | 1 refills | Status: AC | PRN
  Filled 2023-04-18 – 2023-05-23 (×2): qty 30, 30d supply, fill #0
  Filled 2023-06-23: qty 30, 30d supply, fill #1
  Filled 2023-07-15: qty 30, 30d supply, fill #2
  Filled 2023-08-12: qty 30, 30d supply, fill #3
  Filled 2023-08-27: qty 30, 30d supply, fill #4

## 2023-04-18 ENCOUNTER — Other Ambulatory Visit (HOSPITAL_COMMUNITY): Payer: Self-pay

## 2023-04-18 ENCOUNTER — Other Ambulatory Visit: Payer: Self-pay

## 2023-04-18 MED ORDER — TRIAMTERENE-HCTZ 37.5-25 MG PO TABS
1.0000 | ORAL_TABLET | Freq: Every morning | ORAL | 1 refills | Status: AC
  Filled 2023-04-18: qty 30, 30d supply, fill #0

## 2023-04-18 MED ORDER — TADALAFIL 20 MG PO TABS
20.0000 mg | ORAL_TABLET | ORAL | 1 refills | Status: AC | PRN
  Filled 2023-04-18 – 2023-05-23 (×2): qty 90, 90d supply, fill #0

## 2023-04-19 ENCOUNTER — Other Ambulatory Visit (HOSPITAL_COMMUNITY): Payer: Self-pay

## 2023-04-19 ENCOUNTER — Other Ambulatory Visit: Payer: Self-pay

## 2023-04-19 MED ORDER — TRIAMTERENE-HCTZ 37.5-25 MG PO TABS
1.0000 | ORAL_TABLET | Freq: Every day | ORAL | 1 refills | Status: AC
  Filled 2023-04-19: qty 30, 30d supply, fill #0

## 2023-04-19 MED ORDER — AMLODIPINE BESYLATE-VALSARTAN 10-320 MG PO TABS
1.0000 | ORAL_TABLET | Freq: Every day | ORAL | 1 refills | Status: AC
  Filled 2023-04-19 – 2023-05-23 (×2): qty 30, 30d supply, fill #0
  Filled 2023-08-12: qty 30, 30d supply, fill #1

## 2023-04-20 ENCOUNTER — Other Ambulatory Visit (HOSPITAL_COMMUNITY): Payer: Self-pay

## 2023-05-03 ENCOUNTER — Other Ambulatory Visit (HOSPITAL_COMMUNITY): Payer: Self-pay

## 2023-05-23 ENCOUNTER — Other Ambulatory Visit (HOSPITAL_COMMUNITY): Payer: Self-pay

## 2023-05-23 ENCOUNTER — Other Ambulatory Visit: Payer: Self-pay

## 2023-06-23 ENCOUNTER — Other Ambulatory Visit (HOSPITAL_COMMUNITY): Payer: Self-pay

## 2023-06-23 MED ORDER — TADALAFIL 20 MG PO TABS
20.0000 mg | ORAL_TABLET | Freq: Every day | ORAL | 0 refills | Status: AC
  Filled 2023-06-23: qty 4, 30d supply, fill #0

## 2023-07-15 ENCOUNTER — Other Ambulatory Visit (HOSPITAL_COMMUNITY): Payer: Self-pay

## 2023-08-12 ENCOUNTER — Other Ambulatory Visit (HOSPITAL_COMMUNITY): Payer: Self-pay

## 2023-08-27 ENCOUNTER — Other Ambulatory Visit (HOSPITAL_COMMUNITY): Payer: Self-pay

## 2023-10-10 ENCOUNTER — Telehealth (HOSPITAL_COMMUNITY): Payer: Self-pay

## 2023-10-10 NOTE — Telephone Encounter (Signed)
 Outside/paper referral received by Dr. Roseanne Reno from [facility]. Will fax over Physician order and request further documents. Insurance benefits and eligibility to be determined.

## 2023-10-10 NOTE — Telephone Encounter (Signed)
 Pt is interested in the cardiac rehab program fax over MD order request and EKG request form to fax number [phone].

## 2023-10-11 ENCOUNTER — Telehealth (HOSPITAL_COMMUNITY): Payer: Self-pay | Admitting: *Deleted

## 2023-10-11 NOTE — Telephone Encounter (Signed)
 Message left on departmental voicemail for Cardiac Rehab by [facility].  Inquiring the scheduling status for this pt.  Returned call to [employer].  Advised the referral had been received and request for additional documents sent to referring provider on 1/6. Pt has post hospitalization follow up scheduled for 1/31.  Once the follow up appt has been completed and cleared  by the provider for CR and we receive the requested documents this pt will be scheduled. Verbalized understanding. Saturnino Bernett PEAK, BSN Cardiac and Emergency Planning/management Officer

## 2023-11-11 ENCOUNTER — Telehealth (HOSPITAL_COMMUNITY): Payer: Self-pay | Admitting: *Deleted

## 2023-11-11 NOTE — Telephone Encounter (Signed)
 Unsure if pt completed his follow up appt with the hand surgeon scheduled for 2/6.  Called and left message for pt requesting call back.  Pt has completed his cardiology follow up appt and indicated that he was no longer in need of physical therapy.  Would like to have an understanding of what he is capable of doing with the hand as much of the exercise here at Cardiac rehab does involve some hand movement. Await call back. Saturnino Bernett PEAK, BSN Cardiac and Emergency Planning/management Officer

## 2023-11-29 ENCOUNTER — Telehealth (HOSPITAL_COMMUNITY): Payer: Self-pay

## 2023-11-29 NOTE — Telephone Encounter (Signed)
 No response from in regards to Cardiac Rehab  Closed referral

## 2024-01-16 ENCOUNTER — Emergency Department (HOSPITAL_BASED_OUTPATIENT_CLINIC_OR_DEPARTMENT_OTHER): Payer: PRIVATE HEALTH INSURANCE

## 2024-01-16 ENCOUNTER — Emergency Department (HOSPITAL_BASED_OUTPATIENT_CLINIC_OR_DEPARTMENT_OTHER)
Admission: EM | Admit: 2024-01-16 | Discharge: 2024-01-17 | Disposition: A | Discharge status: Home / Self Care | Payer: PRIVATE HEALTH INSURANCE | Attending: Emergency Medicine | Admitting: Emergency Medicine

## 2024-01-16 ENCOUNTER — Encounter (HOSPITAL_BASED_OUTPATIENT_CLINIC_OR_DEPARTMENT_OTHER): Payer: Self-pay | Admitting: Emergency Medicine

## 2024-01-16 ENCOUNTER — Other Ambulatory Visit: Payer: Self-pay

## 2024-01-16 DIAGNOSIS — W01198A Fall on same level from slipping, tripping and stumbling with subsequent striking against other object, initial encounter: Secondary | ICD-10-CM | POA: Insufficient documentation

## 2024-01-16 DIAGNOSIS — S46911A Strain of unspecified muscle, fascia and tendon at shoulder and upper arm level, right arm, initial encounter: Secondary | ICD-10-CM | POA: Diagnosis not present

## 2024-01-16 DIAGNOSIS — S4991XA Unspecified injury of right shoulder and upper arm, initial encounter: Secondary | ICD-10-CM | POA: Diagnosis present

## 2024-01-16 DIAGNOSIS — I10 Essential (primary) hypertension: Secondary | ICD-10-CM | POA: Diagnosis not present

## 2024-01-16 DIAGNOSIS — Z79899 Other long term (current) drug therapy: Secondary | ICD-10-CM | POA: Insufficient documentation

## 2024-01-16 LAB — COMPREHENSIVE METABOLIC PANEL WITH GFR
ALT: 19 U/L (ref 0–44)
AST: 24 U/L (ref 15–41)
Albumin: 3.7 g/dL (ref 3.5–5.0)
Alkaline Phosphatase: 51 U/L (ref 38–126)
Anion gap: 8 (ref 5–15)
BUN: 10 mg/dL (ref 6–20)
CO2: 29 mmol/L (ref 22–32)
Calcium: 9 mg/dL (ref 8.9–10.3)
Chloride: 100 mmol/L (ref 98–111)
Creatinine, Ser: 1.04 mg/dL (ref 0.61–1.24)
GFR, Estimated: >60 mL/min (ref >60)
Glucose, Bld: 94 mg/dL (ref 70–99)
Potassium: 3.9 mmol/L (ref 3.5–5.1)
Sodium: 137 mmol/L (ref 135–145)
Total Bilirubin: 0.5 mg/dL (ref 0.0–1.2)
Total Protein: 7 g/dL (ref 6.5–8.1)

## 2024-01-16 LAB — CBC
HCT: 41.4 % (ref 39.0–52.0)
Hemoglobin: 13.5 g/dL (ref 13.0–17.0)
MCH: 27.8 pg (ref 26.0–34.0)
MCHC: 32.6 g/dL (ref 30.0–36.0)
MCV: 85.2 fL (ref 80.0–100.0)
Platelets: 218 10*3/uL (ref 150–400)
RBC: 4.86 MIL/uL (ref 4.22–5.81)
RDW: 14.2 % (ref 11.5–15.5)
WBC: 7.6 10*3/uL (ref 4.0–10.5)
nRBC: 0 % (ref 0.0–0.2)

## 2024-01-16 LAB — TROPONIN I (HIGH SENSITIVITY): Troponin I (High Sensitivity): 14 ng/L (ref <18)

## 2024-01-16 MED ORDER — LIDOCAINE 5 % EX PTCH: 1.0000 | MEDICATED_PATCH | CUTANEOUS | 0 refills | Status: AC

## 2024-01-16 MED ORDER — METHOCARBAMOL 500 MG PO TABS: 500.0000 mg | ORAL_TABLET | Freq: Two times a day (BID) | ORAL | 0 refills | Status: AC

## 2024-01-16 NOTE — ED Triage Notes (Signed)
 Hypertension at [facility] today where he was seen for right shoulder pain . Reports had syncopal episode 2 [place] ago [place] . Did not seek medical exam then .  Denies dizziness or chest pain , yet reports ringing to ears .  Alert and oriented x 4 .

## 2024-01-16 NOTE — ED Provider Notes (Signed)
 [place] [facility] DEPARTMENT AT [facility] Provider Note   CSN: [id number] Arrival date & time: 01/16/24  1834     History  Chief Complaint  Patient presents with   Hypertension   Shoulder Pain    Timothy Stewart is a 53 y.o. 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  He reports that he had a sudden syncopal episode with no prodrome 2 weeks ago, fell and hit his right shoulder.  He has been having pain in the shoulder since then.  He denies any head pain, neck pain.    Hypertension  Shoulder Pain      Home Medications Prior to Admission medications   Medication Sig Start Date End Date Taking? Authorizing Provider  lidocaine (LIDODERM) 5 % Place 1 patch onto the skin daily. Remove & Discard patch within 12 hours or as directed by MD 01/16/24  Yes Tadarius Maland H, PA-C  methocarbamol (ROBAXIN) 500 MG tablet Take 1 tablet (500 mg total) by mouth 2 (two) times daily. 01/16/24  Yes Nare Gaspari H, PA-C  albuterol (VENTOLIN HFA) 108 (90 Base) MCG/ACT inhaler Inhale 2 puffs into the lungs every 6 (six) hours as needed. 09/21/22     amLODipine (NORVASC) 10 MG tablet Take 1 tablet (10 mg total) by mouth daily. 06/07/19   Harris, Abigail, PA-C  amLODipine-valsartan (EXFORGE) 10-320 MG tablet Take 1 tablet by mouth daily in the morning for blood pressure. 04/19/23     diclofenac (VOLTAREN) 75 MG EC tablet Take 1 tablet (75 mg total) by mouth 2 (two) times daily. 10/31/20   Lenn Sink, DPM  EPINEPHrine (EPIPEN 2-PAK) 0.3 mg/0.3 mL SOAJ injection Inject 0.3 mLs (0.3 mg total) into the muscle once as needed (for severe allergic reaction). CAll 911 immediately if you have to use this medicine 11/26/13   Roxy Horseman, PA-C   tadalafil (CIALIS) 20 MG tablet Take 1 tablet (20 mg total) by mouth daily as needed. 04/11/23     tadalafil (CIALIS) 20 MG tablet Take 1 tablet (20 mg total) by mouth daily as needed. 04/11/23     tadalafil (CIALIS) 20 MG tablet Take 1 tablet (20 mg total) by mouth daily as needed. 06/23/23     terbinafine (LAMISIL) 250 MG tablet Take 1 tablet (250 mg total) by mouth daily. 01/21/22   Lenn Sink, DPM  traZODone (DESYREL) 50 MG tablet Take 0.5-2 tablets (25-100 mg total) by mouth daily 1 hour before bed for sleep 09/02/22     triamterene-hydrochlorothiazide (MAXZIDE-25) 37.5-25 MG tablet Take 1 tablet by mouth in the morning for blood pressure 09/28/22     triamterene-hydrochlorothiazide (MAXZIDE-25) 37.5-25 MG tablet Take 1 tablet by mouth in the morning for blood pressure. 09/28/22     triamterene-hydrochlorothiazide (MAXZIDE-25) 37.5-25 MG tablet Take 1 tablet by mouth daily for blood pressure. 04/19/23         Allergies    Lisinopril and Codeine    Review of Systems   Review of Systems  All other systems reviewed and are negative.   Physical Exam Updated Vital Signs BP (!) 189/117   Pulse (!) 56   Temp 98.1 F (36.7 C)   Resp 18   Wt 86.2 kg   SpO2 98%   BMI 26.13 kg/m  Physical Exam Vitals and nursing note reviewed.  Constitutional:      General: He is not in acute distress.    Appearance: Normal appearance.  HENT:     Head: Normocephalic and atraumatic.  Eyes:     General:        Right eye: No discharge.        Left eye: No discharge.  Cardiovascular:     Rate and Rhythm: Regular rhythm. Bradycardia present.     Heart sounds: No murmur heard.    No friction rub. No gallop.  Pulmonary:     Effort: Pulmonary effort is normal.     Breath sounds: Normal breath sounds.  Abdominal:     General: Bowel sounds are normal.     Palpations: Abdomen is soft.  Musculoskeletal:     Comments: Focal tenderness to palpation of the right clavicle, right shoulder without  step-off, deformity.  No midline cervical spinal tenderness.  No signs of head trauma.  Skin:    General: Skin is warm and dry.     Capillary Refill: Capillary refill takes less than 2 seconds.  Neurological:     Mental Status: He is alert and oriented to person, place, and time.     Comments: Moves all 4 limbs spontaneously, CN II through XII grossly intact, can ambulate without difficulty, intact sensation throughout.  Psychiatric:        Mood and Affect: Mood normal.        Behavior: Behavior normal.     ED Results / Procedures / Treatments   Labs (all labs ordered are listed, but only abnormal results are displayed) Labs Reviewed  CBC  COMPREHENSIVE METABOLIC PANEL WITH GFR  TROPONIN I (HIGH SENSITIVITY)    EKG None  Radiology DG Shoulder Right Result Date: 01/16/2024 CLINICAL DATA:  Fall, right shoulder injury EXAM: RIGHT SHOULDER - 2+ VIEW COMPARISON:  None Available. FINDINGS: There is no evidence of fracture or dislocation. There is no evidence of arthropathy or other focal bone abnormality. Soft tissues are unremarkable. IMPRESSION: Negative. Electronically Signed   By: Worthy Heads M.D.   On: 01/16/2024 22:58   DG Chest 2 View Result Date: 01/16/2024 CLINICAL DATA:  Chest pain EXAM: CHEST - 2 VIEW COMPARISON:  None Available. FINDINGS: The heart size and mediastinal contours are within normal limits. Both lungs are clear. The visualized skeletal structures are unremarkable. IMPRESSION: No active cardiopulmonary disease. Electronically Signed   By: Worthy Heads M.D.   On: 01/16/2024 22:58    Procedures Procedures    Medications Ordered in ED Medications - No data to display  ED Course/ Medical Decision Making/ A&P                                 Medical Decision Making  This patient is a 53 y.o. male  who presents to the ED for concern of syncope, shoulder pain on the right.   Differential diagnoses prior to evaluation: The emergent differential diagnosis  includes, but is not limited to,  CVA, ACS, arrhythmia, vasovagal / orthostatic hypotension, sepsis, hypoglycemia, electrolyte disturbance, respiratory failure, anemia, dehydration, heat injury, polypharmacy, malignancy, anxiety/panic attack, considered fracture, dislocation versus other musculoskeletal injury of the shoulder. This is not an exhaustive differential.   Past Medical History / Co-morbidities / Social History: hypertension, hyperlipidemia, EtOH dependence, depression  Physical Exam: Physical exam performed. The pertinent findings include: Patient with some significant hypertension, blood pressure  186/110, max 194/120 in the ED, he does have significant diastolic severe hypertension as well as fairly severe systolic hypertension, no sign of acute organ dysfunction today, we discussed changing his  Lab Tests/Imaging studies: I personally interpreted labs/imaging and the pertinent results include: CBC unremarkable, CMP unremarkable, notably with normal kidney function, his one-time troponin is 14 in context of no chest pain, syncope occurring 2 weeks ago.  Plain film radiograph of chest and right shoulder revealed no acute fracture, dislocation, overall with high clinical suspicion for right shoulder strain from his fall, some rotator cuff dysfunction, no dislocation, low clinical suspicion for occult fracture. I agree with the radiologist interpretation.  Cardiac monitoring: EKG obtained and interpreted by myself and attending physician which shows: Sinus bradycardia, no acute ischemic ST changes   Medications: Plan to discharge with ibuprofen, Tylenol, muscle relaxant, and lidocaine patches for his muscular pain, orthopedic follow-up   Disposition: After consideration of the diagnostic results and the patients response to treatment, I feel that patient stable for discharge with plan as above.   emergency department workup does not suggest an emergent condition requiring admission or  immediate intervention beyond what has been performed at this time. The plan is: as above. The patient is safe for discharge and has been instructed to return immediately for worsening symptoms, change in symptoms or any other concerns.  Final Clinical Impression(s) / ED Diagnoses Final diagnoses:  Poorly-controlled hypertension  Strain of right shoulder, initial encounter    Rx / DC Orders ED Discharge Orders          Ordered    methocarbamol (ROBAXIN) 500 MG tablet  2 times daily        01/16/24 2352    lidocaine (LIDODERM) 5 %  Every 24 hours        01/16/24 2352              Kanoelani Dobies, [place] H, PA-C 01/16/24 2354    Merdis Stalling, MD 01/17/24 [phone]

## 2024-01-16 NOTE — Discharge Instructions (Addendum)
 Please use Tylenol or ibuprofen for pain.  You may use 600 mg ibuprofen every 6 hours or 1000 mg of Tylenol every 6 hours.  You may choose to alternate between the 2.  This would be most effective.  Not to exceed 4 g of Tylenol within 24 hours.  Not to exceed 3200 mg ibuprofen 24 hours.  You can use the muscle relaxant I am prescribing in addition to the above to help with any breakthrough pain.  You can take it up to twice daily.  It is safe to take at night, but I would be cautious taking it during the day as it can cause some drowsiness.  Make sure that you are feeling awake and alert before you get behind the wheel of a car or operate a motor vehicle.  It is not a narcotic pain medication so you are able to take it if it is not making you drowsy and still pilot a vehicle or machinery safely.  Please follow-up with your primary care doctor to discuss blood pressure changes given your persistent hypertension despite taking your home amlodipine.
